# Patient Record
Sex: Female | Born: 1985 | Race: White | Hispanic: No | Marital: Married
Health system: Midwestern US, Community
[De-identification: ages and names within clinical notes are randomized; demographics above are authoritative.]

---

## 2020-11-26 NOTE — ED Notes (Signed)
 ED Patient Summary       ;          [facility]  [address], [place], [place] [address]  [phone]  Discharge Instructions (Patient)  Name:[name], [name]  DOB:  28-Dec-1986                   MRN: [id number]                   FIN: [id number]  Reason For Visit: Allergic reaction - minor; ALLERGIC REACTION  Final Diagnosis: Acute allergic reaction; Dental caries; Penicillin allergy; Urticaria     Visit Date: 11/26/2020 06:09:00  Address: [address] [name] [address]  Phone: [phone]     Emergency Department Providers:         Primary Physician:      EZZARD SIEVING [facility] would like to thank you for allowing us  to assist you with your healthcare needs. The following includes patient education materials and information regarding your injury/illness.     Follow-up Instructions:  You were seen today on an emergency basis. Please contact your primary care doctor for a follow up appointment. If you received a referral to a specialist doctor, it is important you follow-up as instructed.    It is important that you call your follow-up doctor to schedule and confirm the location of your next appointment. Your doctor may practice at multiple locations. The office location of your follow-up appointment may be different to the one written on your discharge instructions.    If you do not have a primary care doctor, please call [phone] for help in finding a [name] Provider. For help in finding a specialist doctor, please call [phone].    If your condition gets worse before your follow-up with your primary care doctor or specialist, please return to the Emergency Department.      Coronavirus 2019 (COVID-19) Reminders:     Patients age 101 - 49, with parental consent, and over age 36 can make an appointment for a COVID-19 vaccine. Patients can contact their [name] doctors' offices to schedule  an appointment to receive the COVID-19 vaccine. Patients who do not have a [name] physician can call [phone] to schedule vaccination appointments.      Follow Up Appointments:  Primary Care Provider:     Name: PCP,  NONE     Phone:                  With: Address: When:   Please follow-up with dentist as soon as possible of potential list of the dental clinics to be seen  Within 1 week       With: Address: When:   Follow up with primary care provider  Within 1 week                   New Medications  Printed Prescriptions  clindamycin (clindamycin 300 mg oral capsule) 1 Tabs Oral (given by mouth) 4 times a day for 7 Days. Refills: 0.  Last Dose:____________________  hydrOXYzine (hydrOXYzine hydrochloride 25 mg oral tablet) 1 Tabs Oral (given by mouth) 3 times a day for 7 Days. Refills: 0.  Last Dose:____________________  predniSONE  (predniSONE  20 mg oral tablet) 2 Tabs Oral (given by mouth) every day for 5 Days. Refills: 0.  Last Dose:____________________  traMADol (Ultram 50 mg  oral tablet) 1 Tabs Oral (given by mouth) every 6 hours as needed moderate pain (4-7) for 3 Days. Refills: 0.  Last Dose:____________________      Allergy Info: penicillin     Discharge Additional Information          Discharge Patient 11/26/20 6:51:00 EST      Patient Education Materials:                  Alcohol and Drug Clinic Referrals       [facility]   [phone]  [email or web address]-[email or web address]     Alcoholics Anonymous [place] ([employer] Hotline)  Ph- ([phone]     Co-dependents, and Adult Children of Alcoholics  Ph- [phone]     [facility]  [address], [place] [address]   [phone]     [facility]  [address] [place] [address]  [phone]  Inpatient and Outpatient Services for Drug and Alcohol Detox, Methadone Clinic and Detox (prescriptions for methadone are not given). Insured and uninsured.       [facility] Alcohol and Drug Abuse Hotline  Ph- [phone]     [facility] Commission on Alcohol and Drugs  [address], [place] [address]   ph- [phone] Narcotics Anonymous Hotline   [phone]     [facility] Alcohol [facility], [facility]   [address] [place] [address]   [phone], (referral coordinator) [phone]  Call for appointments.     [place]  Department of Health and Human Services: Substance Abuse and Mental Health Services Administration  SkateOasis.com.pt     National Suicide Prevention Lifeline  1-800-273-TALK 256-381-4019)  www.suicidepreventionlifeline.org     Drug Addiction Assistance Helpline  502 505 6018       Hives    Hives are itchy, red, puffy (swollen) areas of the skin. Hives can change in size and location on your body. Hives can come and go for hours, days, or weeks. Hives do not spread from person to person (noncontagious). Scratching, exercise, and stress can make your hives worse.      HOME CARE     Avoid things that cause your hives (triggers).     Take antihistamine medicines as told by your doctor. Do not drive while taking an antihistamine.     Take any other medicines for itching as told by your doctor.     Wear loose-fitting clothing.     Keep all doctor visits as told.    GET HELP RIGHT AWAY IF:     You have a fever.     Your tongue or lips are puffy.     You have trouble breathing or swallowing.     You feel tightness in the throat or chest.     You have belly (abdominal) pain.     You have lasting or severe itching that is not helped by medicine.     You have painful or puffy joints.    These problems may be the first sign of a life-threatening allergic reaction. Call your local emergency services (911 in [place]    MAKE SURE YOU:     Understand these instructions.     Will watch your condition.     Will get help right away if you are not doing well or get worse.  This information is  not intended to replace advice given to you by your health care provider. Make sure you discuss any questions you have with your health care provider.    Document Released: 09/23/2008 Document Revised: 06/15/2012 Document Reviewed: 10/03/2015  Elsevier Interactive Patient Education ?2016 Elsevier Inc.       Dental Caries    Dental caries is tooth decay. This decay can cause a hole in teeth (cavity) that can get bigger and deeper over time.      HOME CARE     Brush and floss your teeth. Do this at least two times a day.     Use a fluoride toothpaste.     Use a mouth rinse if told by your dentist or doctor.      Eat less sugary and starchy foods. Drink less sugary drinks.     Avoid snacking often on sugary and starchy foods. Avoid sipping often on sugary drinks.     Keep regular checkups and cleanings with your dentist.     Use fluoride supplements if told by your dentist or doctor.     Allow fluoride to be applied to teeth if told by your dentist or doctor.     This information is not intended to replace advice given to you by your health care provider. Make sure you discuss any questions you have with your health care provider.    Document Released: 09/23/2008 Document Revised: 01/05/2015 Document Reviewed: 12/17/2012  Elsevier Interactive Patient Education ?2016 Elsevier Inc.      ---------------------------------------------------------------------------------------------------------------------  [name]) encourages you to self-enroll in the [facility] Patient Portal.  [facility] Patient Portal will allow you to manage your personal health information securely from your own electronic device now and in the future.  To begin your Patient Portal enrollment process, please visit [email or web address] Click on "Sign up now" under [facility].  If you find that you need additional assistance on the [facility] Patient Portal or need a copy of your medical records,  please call the [facility] Medical Records Office at [phone].  Comment:

## 2020-11-26 NOTE — ED Notes (Signed)
ED Triage Note       ED Secondary Triage Entered On:  11/26/2020 6:23 EST    Performed On:  11/26/2020 6:21 EST by Fay Records               General Information   Barriers to Learning :   None evident   ED Home Meds Section :   Document assessment   [facility] ED Fall Risk Section :   Document assessment   ED Advance Directives Section :   Document assessment   ED Palliative Screen :   N/A (prefilled for <34yo)   Fay Records - 11/26/2020 6:21 EST   (As Of: 11/26/2020 06:23:01 EST)   Diagnoses(Active)    Acute allergic reaction  Date:   11/26/2020 ; Diagnosis Type:   Discharge ; Confirmation:   Confirmed ; Clinical Dx:   Acute allergic reaction ; Classification:   Medical ; Clinical Service:   Non-Specified ; Code:   ICD-10-CM ; Probability:   0 ; Diagnosis Code:   T78.40XA      Allergic reaction - minor  Date:   11/26/2020 ; Diagnosis Type:   Reason For Visit ; Confirmation:   Complaint of ; Clinical Dx:   Allergic reaction - minor ; Classification:   Medical ; Clinical Service:   Emergency medicine ; Code:   PNED ; Probability:   0 ; Diagnosis Code:   [id number]      Dental caries  Date:   11/26/2020 ; Diagnosis Type:   Discharge ; Confirmation:   Confirmed ; Clinical Dx:   Dental caries ; Classification:   Medical ; Clinical Service:   Non-Specified ; Code:   ICD-10-CM ; Probability:   0 ; Diagnosis Code:   K02.9      Penicillin allergy  Date:   11/26/2020 ; Diagnosis Type:   Discharge ; Confirmation:   Confirmed ; Clinical Dx:   Penicillin allergy ; Classification:   Medical ; Clinical Service:   Non-Specified ; Code:   ICD-10-CM ; Probability:   0 ; Diagnosis Code:   Z88.0      Urticaria  Date:   11/26/2020 ; Diagnosis Type:   Discharge ; Confirmation:   Confirmed ; Clinical Dx:   Urticaria ; Classification:   Medical ; Clinical Service:   Non-Specified ; Code:   ICD-10-CM ; Probability:   0 ; Diagnosis Code:   L50.9             -    Procedure History   (As Of: 11/26/2020 06:23:01  EST)     [facility] Fall Risk Assessment Tool   Hx of falling last 3 months ED Fall :   No   Patient confused or disoriented ED Fall :   No   Patient intoxicated or sedated ED Fall :   No   Patient impaired gait ED Fall :   No   Use a mobility assistance device ED Fall :   No   Patient altered elimination ED Fall :   No   [facility] ED Fall Score :   0    Fay Records - 11/26/2020 6:21 EST   ED Advance Directive   Advance Directive :   No   Fay Records - 11/26/2020 6:21 EST   Med Hx   Medication List   (As Of: 11/26/2020 06:23:01 EST)   No Known Home Medications     Fay Records - 11/26/2020 06:22:42      Normal  Order    diphenhydrAMINE 25 mg Cap  :   diphenhydrAMINE 25 mg Cap ; Status:   Completed ; Ordered As Mnemonic:   Benadryl ; Simple Display Line:   50 mg, 2 caps, Oral, Once ; Ordering Provider:   Merlinda Frederick; Catalog Code:   diphenhydrAMINE ; Order Dt/Tm:   11/26/2020 06:17:38 EST          famotidine 20 mg Tab  :   famotidine 20 mg Tab ; Status:   Completed ; Ordered As Mnemonic:   Pepcid ; Simple Display Line:   20 mg, 1 tabs, Oral, Once ; Ordering Provider:   Merlinda Frederick; Catalog Code:   famotidine ; Order Dt/Tm:   11/26/2020 06:17:38 EST          predniSONE 20 mg Tab  :   predniSONE 20 mg Tab ; Status:   Completed ; Ordered As Mnemonic:   predniSONE ; Simple Display Line:   60 mg, 3 tabs, Oral, Once ; Ordering Provider:   Merlinda Frederick; Catalog Code:   predniSONE ; Order Dt/Tm:   11/26/2020 06:17:38 EST

## 2020-11-26 NOTE — Discharge Summary (Signed)
ED Clinical Summary                         [facility]  [address]  [place], [place], [address]  [phone]           PERSON INFORMATION  Name: [name] [name], [name] [name] Age:  34 Years DOB: [birth year]   Sex: Female Language: English PCP: PCP,  NONE   Marital Status:  Married Phone: [phone] Med Service: MED-Medicine   MRN:  [id number] Acct# [id number] Arrival: 11/26/2020 06:09:00   Visit Reason: Allergic reaction - minor; ALLERGIC REACTION Acuity: 3 LOS: 000 00:49   Address:      [address] [place] [address]  Diagnosis:      Acute allergic reaction; Dental caries; Penicillin allergy; Urticaria  Printed Prescriptions:            Allergies      penicillin (Rash)      Medications Administered During Visit:                  Medication Dose Route   diphenhydrAMINE 50 mg Oral   famotidine 20 mg Oral   predniSONE 60 mg Oral       Patient Medication List:              clindamycin (clindamycin 300 mg oral capsule) 1 Tabs Oral (given by mouth) 4 times a day for 7 Days. Refills: 0.  hydrOXYzine (hydrOXYzine hydrochloride 25 mg oral tablet) 1 Tabs Oral (given by mouth) 3 times a day for 7 Days. Refills: 0.  predniSONE (predniSONE 20 mg oral tablet) 2 Tabs Oral (given by mouth) every day for 5 Days. Refills: 0.  traMADol (Ultram 50 mg oral tablet) 1 Tabs Oral (given by mouth) every 6 hours as needed moderate pain (4-7) for 3 Days. Refills: 0.         Major Tests and Procedures:  The following procedures and tests were performed during your ED visit.  COMMONPROCEDURES%>  COMMON PROCEDURESCOMMENTS%>          Laboratory Orders  No laboratory orders were placed.              Radiology Orders  No radiology orders were placed.              Patient Care Orders  Name Status Details   Discharge Patient Ordered 11/26/20 6:51:00 EST   ED Assessment Adult Completed 11/26/20 6:17:18 EST, 11/26/20 6:17:18 EST   ED Secondary Triage Completed 11/26/20 6:17:18 EST, 11/26/20 6:17:18 EST   ED  Triage Adult Completed 11/26/20 6:09:59 EST, 11/26/20 6:09:59 EST             PROVIDER INFORMATION               Provider Role Assigned Johnette Abraham [facility] ED Provider 11/26/2020 06:12:25    Fay Records ED Nurse 11/26/2020 06:16:25        Attending Physician:  Novella Olive ETHAN-MD     Admit Doc  LEWIS,  DANIEL ETHAN-MD     Consulting Doc       VITALS INFORMATION  Vital Sign Triage Latest   Temp Oral ORAL_1%>36.6 degC ORAL%>36.6 degC   Temp Temporal TEMPORAL_1%> TEMPORAL%>   Temp Intravascular INTRAVASCULAR_1%> INTRAVASCULAR%>   Temp Axillary AXILLARY_1%> AXILLARY%>   Temp Rectal RECTAL_1%> RECTAL%>   02 Sat 95 % 100 %   Respiratory Rate RATE_1%>16 br/min RATE%>17 br/min  Peripheral Pulse Rate PULSE RATE_1%> PULSE RATE%>   Apical Heart Rate HEART RATE_1%> HEART RATE%>   Blood Pressure BLOOD PRESSURE_1%>/ BLOOD PRESSURE_1%>92 mmHg BLOOD PRESSURE%>117 mmHg / BLOOD PRESSURE%>76 mmHg                 Immunizations      No Immunizations Documented This Visit          DISCHARGE INFORMATION   Discharge Disposition: H Outpt-Sent Home   Discharge Location:    Home   Discharge Date and Time:    11/26/2020 06:58:54   ED Checkout Date and Time:    11/26/2020 06:58:54     DEPART REASON INCOMPLETE INFORMATION               Depart Action Incomplete Reason   Interactive View/I&O Recently assessed               Problems      No Problems Documented              Smoking Status      No Smoking Status Documented         PATIENT EDUCATION INFORMATION  Instructions:       Alcohol & Drug Clinic Referrals (CUSTOM); Hives, Easy-to-Read; Dental Caries, Easy-to-Read     Follow up:                    With: Address: When:   Please follow-up with dentist as soon as possible of potential list of the dental clinics to be seen  Within 1 week       With: Address: When:   Follow up with primary care provider  Within 1 week           ED PROVIDER DOCUMENTATION     Patient:   [name] [name], [name] [name]             MRN: [id number]            FIN:  [phone]               Age:   34 years     Sex:  Female     DOB:  [birth year]   Associated Diagnoses:   Dental caries; Acute allergic reaction; Urticaria; Penicillin allergy   Author:   Merlinda Frederick      Basic Information   Time seen: Immediately upon arrival.   History source: Patient.   Arrival mode: Private vehicle.   Additional information: Chief Complaint from Nursing Triage Note   Chief Complaint  Chief Complaint: pt reports she thinks she is having an allergic reaction to penicillin that she took for a tooth infection.  pt reports itching all over (11/26/20 06:14:00).      History of Present Illness   The patient presents with dental pain and mouth pain.  The onset was 3  days ago.  The course/duration of symptoms is fluctuating in intensity.  Type of injury: none.  Location: Left lower. The character of symptoms is pain.  The degree of pain is moderate.  The exacerbating factor is drinking.  The relieving factor is rest.  Risk factors consist of none.  Prior episodes: occasional.  Therapy today: see nurses notes.  Associated symptoms: denies fever, denies chills and denies sore throat.  Additional history: Patient has penicillin allergy, took a dose of penicillin and then now also has a secondary complaint has an acute allergic reaction with itching diffusely.  She has a mild urticarial rash noted over  the face and upper extremities denies any shortness of breath denies any fevers or chills denies any radiation of symptoms.        Review of Systems   Constitutional symptoms:  No fever, no chills.    ENMT symptoms:  +dental caries   .             Additional review of systems information: All other systems reviewed and otherwise negative.      Health Status   Allergies:    Allergic Reactions (All)  Mild  Penicillin- Rash..   Medications:  (Selected)   Inpatient Medications  Ordered  Benadryl: 50 mg, Oral, Once  Pepcid: 20 mg, Oral, Once  predniSONE: 60 mg, 3 tabs, Oral, Once.      Past Medical/  Family/ Social History   Medical history:    No active or resolved past medical history items have been selected or recorded., Reviewed as documented in chart.   Surgical history:    No active procedure history items have been selected or recorded., Reviewed as documented in chart.   Family history: Not significant.   Social history:    Social & Psychosocial Habits    No Data Available  , Reviewed as documented in chart.   Problem list: Per nurse's notes.      Physical Examination               Vital Signs   Vital Signs   11/26/2020 6:14 EST Systolic Blood Pressure 142 mmHg  HI    Diastolic Blood Pressure 92 mmHg  HI    Temperature Oral 36.6 degC    Heart Rate Monitored 89 bpm    Respiratory Rate 16 br/min    SpO2 95 %   .   Measurements   11/26/2020 6:17 EST Body Mass Index est meas 43.28 kg/m2    Body Mass Index Measured 43.28 kg/m2   11/26/2020 6:14 EST Height/Length Measured 163 cm    Weight Dosing 115 kg   .   Basic Oxygen Information   11/26/2020 6:17 EST Oxygen Therapy Room air   11/26/2020 6:14 EST Oxygen Therapy Room air    SpO2 95 %   .   General:  Alert, no acute distress.    Skin:  Warm, dry, Mild urticarial rash noted over face and upper extremities.    Head:  Normocephalic, atraumatic.    Neck:  Supple.   Eye:  Pupils are equal, round and reactive to light, extraocular movements are intact.    Ears, nose, mouth and throat:  Tympanic membranes clear, oral mucosa moist, no pharyngeal erythema or exudate, Tooth: Left, lower, avulsion, dental caries.    Cardiovascular:  Regular rate and rhythm, No murmur.    Respiratory:  Lungs are clear to auscultation, respirations are non-labored.    Neurological:  Alert and oriented to person, place, time, and situation, No focal neurological deficit observed, CN II-XII intact, normal sensory observed, normal motor observed.    Lymphatics:  No lymphadenopathy.   Psychiatric:  Cooperative, appropriate mood & affect.       Medical Decision Making   Differential  Diagnosis:  Dental pain, dental carries.    Documents reviewed:  Emergency department nurses' notes, flowsheet, vital signs no emergency department records,  no prior records.       Reexamination/ Reevaluation   Notes: ultram clindamycin, dental clinic follow-up for the acute reaction Benadryl prednisone and Pepcid.  Follow-up PMD/dentist.      Impression and Plan  Diagnosis   Dental caries (ICD10-CM K02.9, Discharge, Medical)   Dental caries (ICD10-CM K02.9, Discharge, Medical)   Acute allergic reaction (ICD10-CM T78.40XA, Discharge, Medical)   Urticaria (ICD10-CM L50.9, Discharge, Medical)   Penicillin allergy (ICD10-CM Z88.0, Discharge, Medical)   Plan   Condition: Stable.    Disposition: Discharged: Time  11/26/2020 07:00:00, to home.    Prescriptions: Launch prescriptions   Pharmacy:  Ultram 50 mg oral tablet (Prescribe): 50 mg, 1 tabs, Oral, q6hr, for 3 days, PRN: moderate pain (4-7), 12 tabs, 0 Refill(s)  predniSONE 20 mg oral tablet (Prescribe): 40 mg, 2 tabs, Oral, Daily, for 5 days, 10 tabs, 0 Refill(s)  hydrOXYzine hydrochloride 25 mg oral tablet (Prescribe): 25 mg, 1 tabs, Oral, TID, for 7 days, 21 tabs, 0 Refill(s)  clindamycin 300 mg oral capsule (Prescribe): 1 tabs, Oral, QID, for 7 days, 28 tabs, 0 Refill(s).    Patient was given the following educational materials: Pt. education, Dental Caries, Easy-to-Read, Hives, Easy-to-Read, Alcohol & Drug Clinic Referrals (CUSTOM).    Follow up with: Follow up with primary care provider Within 1 week; Please follow-up with dentist as soon as possible of potential list of the dental clinics to be seen Within 1 week.    Counseled: I had a detailed discussion with the patient and/or guardian regarding the historical points/exam findings supporting the discharge diagnosis and need for outpatient followup. Discussed the need to return to the ER if symptoms persist/worsen, or for any questions/concerns that arise at home.

## 2020-11-26 NOTE — ED Notes (Signed)
ED Triage Note       ED Triage Adult Entered On:  11/26/2020 6:17 EST    Performed On:  11/26/2020 6:14 EST by Thane Edu C-RN               Triage   Numeric Rating Pain Scale :   8   Chief Complaint :   pt reports she thinks she is having an allergic reaction to penicillin that she took for a tooth infection.  pt reports itching all over   [place] Mode of Arrival :   Private vehicle   Infectious Disease Documentation :   Document assessment   Patient received chemo or biotherapy last 48 hrs? :   No   Temperature Oral :   36.6 degC(Converted to: 97.9 degF)    Heart Rate Monitored :   89 bpm   Respiratory Rate :   16 br/min   Systolic Blood Pressure :   142 mmHg (HI)    Diastolic Blood Pressure :   92 mmHg (HI)    SpO2 :   95 %   Oxygen Therapy :   Room air   Patient presentation :   None of the above   Chief Complaint or Presentation suggest infection :   No   Dosing Weight Obtained By :   Patient stated   Weight Dosing :   115 kg(Converted to: 253 lb 9 oz)    Height :   163 cm(Converted to: 5 ft 4 in)    Body Mass Index Dosing :   43 kg/m2   Thane Edu C-RN - 11/26/2020 6:14 EST   DCP GENERIC CODE   Tracking Acuity :   3   Tracking Group :   ED [facility]   Thane Edu C-RN - 11/26/2020 6:14 EST   ED General Section :   Document assessment   Pregnancy Status :   Patient denies   Last Menstrual Period :   10/12/2020 EDT   ED Allergies Section :   Document assessment   ED Reason for Visit Section :   Document assessment   Thane Edu C-RN - 11/26/2020 6:14 EST   ID Risk Screen Symptoms   Recent Travel History :   No recent travel   Close Contact with COVID-19 ID :   No   Last 14 days COVID-19 ID :   No   TB Symptom Screen :   No symptoms   C. diff Symptom/History ID :   Neither of the above   Thane Edu C-RN - 11/26/2020 6:14 EST   Allergies   (As Of: 11/26/2020 06:17:17 EST)   Allergies (Active)   penicillin  Estimated Onset Date:   Unspecified ; Reactions:   Rash ;  Created By:   Thane Edu C-RN; Reaction Status:   Active ; Category:   Drug ; Substance:   penicillin ; Type:   Allergy ; Severity:   Mild ; Updated By:   Thane Edu C-RN; Reviewed Date:   11/26/2020 6:16 EST        Psycho-Social   Last 3 mo, thoughts killing self/others :   Patient denies   Right click within box for Suspected Abuse policy link. :   None   Feels Safe Where Live :   Yes   Thane Edu C-RN - 11/26/2020 6:14 EST   ED Reason for Visit   (As Of: 11/26/2020 06:17:17 EST)  Diagnoses(Active)    Allergic reaction - minor  Date:   11/26/2020 ; Diagnosis Type:   Reason For Visit ; Confirmation:   Complaint of ; Clinical Dx:   Allergic reaction - minor ; Classification:   Medical ; Clinical Service:   Emergency medicine ; Code:   PNED ; Probability:   0 ; Diagnosis Code:   [id number]

## 2020-11-26 NOTE — ED Provider Notes (Signed)
Dental Caries        Patient:   [name] [name], [name] [name]             MRN: [id number]            FIN: [id number]               Age:   34 years     Sex:  Female     DOB:  24-May-1986   Associated Diagnoses:   Dental caries; Acute allergic reaction; Urticaria; Penicillin allergy   Author:   Merlinda Frederick      Basic Information   Time seen: Immediately upon arrival.   History source: Patient.   Arrival mode: Private vehicle.   Additional information: Chief Complaint from Nursing Triage Note   Chief Complaint  Chief Complaint: pt reports she thinks she is having an allergic reaction to penicillin that she took for a tooth infection.  pt reports itching all over (11/26/20 06:14:00).      History of Present Illness   The patient presents with dental pain and mouth pain.  The onset was 3  days ago.  The course/duration of symptoms is fluctuating in intensity.  Type of injury: none.  Location: Left lower. The character of symptoms is pain.  The degree of pain is moderate.  The exacerbating factor is drinking.  The relieving factor is rest.  Risk factors consist of none.  Prior episodes: occasional.  Therapy today: see nurses notes.  Associated symptoms: denies fever, denies chills and denies sore throat.  Additional history: Patient has penicillin allergy, took a dose of penicillin and then now also has a secondary complaint has an acute allergic reaction with itching diffusely.  She has a mild urticarial rash noted over the face and upper extremities denies any shortness of breath denies any fevers or chills denies any radiation of symptoms.        Review of Systems   Constitutional symptoms:  No fever, no chills.    ENMT symptoms:  +dental caries   .             Additional review of systems information: All other systems reviewed and otherwise negative.      Health Status   Allergies:    Allergic Reactions (All)  Mild  Penicillin- Rash..   Medications:  (Selected)   Inpatient Medications  Ordered  Benadryl: 50 mg, Oral,  Once  Pepcid: 20 mg, Oral, Once  predniSONE: 60 mg, 3 tabs, Oral, Once.      Past Medical/ Family/ Social History   Medical history:    No active or resolved past medical history items have been selected or recorded., Reviewed as documented in chart.   Surgical history:    No active procedure history items have been selected or recorded., Reviewed as documented in chart.   Family history: Not significant.   Social history:    Social & Psychosocial Habits    No Data Available  , Reviewed as documented in chart.   Problem list: Per nurse's notes.      Physical Examination               Vital Signs   Vital Signs   11/26/2020 6:14 EST Systolic Blood Pressure 142 mmHg  HI    Diastolic Blood Pressure 92 mmHg  HI    Temperature Oral 36.6 degC    Heart Rate Monitored 89 bpm    Respiratory Rate 16 br/min    SpO2 95 %   .  Measurements   11/26/2020 6:17 EST Body Mass Index est meas 43.28 kg/m2    Body Mass Index Measured 43.28 kg/m2   11/26/2020 6:14 EST Height/Length Measured 163 cm    Weight Dosing 115 kg   .   Basic Oxygen Information   11/26/2020 6:17 EST Oxygen Therapy Room air   11/26/2020 6:14 EST Oxygen Therapy Room air    SpO2 95 %   .   General:  Alert, no acute distress.    Skin:  Warm, dry, Mild urticarial rash noted over face and upper extremities.    Head:  Normocephalic, atraumatic.    Neck:  Supple.   Eye:  Pupils are equal, round and reactive to light, extraocular movements are intact.    Ears, nose, mouth and throat:  Tympanic membranes clear, oral mucosa moist, no pharyngeal erythema or exudate, Tooth: Left, lower, avulsion, dental caries.    Cardiovascular:  Regular rate and rhythm, No murmur.    Respiratory:  Lungs are clear to auscultation, respirations are non-labored.    Neurological:  Alert and oriented to person, place, time, and situation, No focal neurological deficit observed, CN II-XII intact, normal sensory observed, normal motor observed.    Lymphatics:  No lymphadenopathy.   Psychiatric:   Cooperative, appropriate mood & affect.       Medical Decision Making   Differential Diagnosis:  Dental pain, dental carries.    Documents reviewed:  Emergency department nurses' notes, flowsheet, vital signs no emergency department records,  no prior records.       Reexamination/ Reevaluation   Notes: ultram clindamycin, dental clinic follow-up for the acute reaction Benadryl prednisone and Pepcid.  Follow-up PMD/dentist.      Impression and Plan   Diagnosis   Dental caries (ICD10-CM K02.9, Discharge, Medical)   Dental caries (ICD10-CM K02.9, Discharge, Medical)   Acute allergic reaction (ICD10-CM T78.40XA, Discharge, Medical)   Urticaria (ICD10-CM L50.9, Discharge, Medical)   Penicillin allergy (ICD10-CM Z88.0, Discharge, Medical)   Plan   Condition: Stable.    Disposition: Discharged: Time  11/26/2020 07:00:00, to home.    Prescriptions: [facility] prescriptions   Pharmacy:  Ultram 50 mg oral tablet (Prescribe): 50 mg, 1 tabs, Oral, q6hr, for 3 days, PRN: moderate pain (4-7), 12 tabs, 0 Refill(s)  predniSONE 20 mg oral tablet (Prescribe): 40 mg, 2 tabs, Oral, Daily, for 5 days, 10 tabs, 0 Refill(s)  hydrOXYzine hydrochloride 25 mg oral tablet (Prescribe): 25 mg, 1 tabs, Oral, TID, for 7 days, 21 tabs, 0 Refill(s)  clindamycin 300 mg oral capsule (Prescribe): 1 tabs, Oral, QID, for 7 days, 28 tabs, 0 Refill(s).    Patient was given the following educational materials: Pt. education, Dental Caries, Easy-to-Read, Hives, Easy-to-Read, Alcohol & Drug Clinic Referrals (CUSTOM).    Follow up with: Follow up with primary care provider Within 1 week; Please follow-up with dentist as soon as possible of potential list of the dental clinics to be seen Within 1 week.    Counseled: I had a detailed discussion with the patient and/or guardian regarding the historical points/exam findings supporting the discharge diagnosis and need for outpatient followup. Discussed the need to return to the ER if symptoms persist/worsen, or for  any questions/concerns that arise at home.    Signature Line     Electronically Signed on 11/26/2020 06:22 AM EST   ________________________________________________   Novella Olive ETHAN-MD

## 2020-11-26 NOTE — ED Notes (Signed)
 ED Patient Education Note     ;Patient Education Materials Follows:               Alcohol and Drug Clinic Referrals       [facility]   [phone]  [email or web address]     Alcoholics Anonymous [place] ([employer] Hotline)  Ph- [phone]     Co-dependents, and Adult Children of Alcoholics  Ph- [phone]     [facility]  [address] [place] [address]   [phone]     [facility]  [address], [place] [address]  [phone]  Inpatient and Outpatient Services for Drug and Alcohol Detox, Methadone Clinic and Detox (prescriptions for methadone are not given). Insured and uninsured.      [facility] Alcohol and Drug Abuse Hotline  Ph- [phone]     [facility] Commission on Alcohol and Drugs  [address], [place] [address]   ph- [phone] Narcotics Anonymous Hotline   9127349127     [facility]   [address], [place] [address]   [phone], (referral coordinator) [phone]  Call for appointments.     [place]  Department of Health and Human Services: Substance Abuse and Mental Health Services Administration  [employer].pt     National Suicide Prevention Lifeline  1-800-273-TALK 413-228-7804)  www.suicidepreventionlifeline.org     Drug Addiction Assistance Helpline  940-034-6519      Allergy     Hives    Hives are itchy, red, puffy (swollen) areas of the skin. Hives can change in size and location on your body. Hives can come and go for hours, days, or weeks. Hives do not spread from person to person (noncontagious). Scratching, exercise, and stress can make your hives worse.      HOME CARE     Avoid things that cause your hives (triggers).     Take antihistamine medicines as told by your doctor. Do not drive while taking an antihistamine.     Take any other medicines for itching as told  by your doctor.     Wear loose-fitting clothing.     Keep all doctor visits as told.    GET HELP RIGHT AWAY IF:     You have a fever.     Your tongue or lips are puffy.     You have trouble breathing or swallowing.     You feel tightness in the throat or chest.     You have belly (abdominal) pain.     You have lasting or severe itching that is not helped by medicine.     You have painful or puffy joints.    These problems may be the first sign of a life-threatening allergic reaction. Call your local emergency services (911 in [place]    MAKE SURE YOU:     Understand these instructions.     Will watch your condition.     Will get help right away if you are not doing well or get worse.    This information is not intended to replace advice given to you by your health care provider. Make sure you discuss any questions you have with your health care provider.    Document Released: 09/23/2008 Document Revised: 06/15/2012 Document Reviewed: 10/03/2015  Elsevier Interactive Patient Education ?2016 [employer]      Dentistry     Dental Caries    Dental caries is tooth decay. This decay can cause a hole in teeth (cavity) that can get bigger and deeper over time.      HOME CARE     Brush and floss your teeth. Do this at least two times a day.     Use a fluoride toothpaste.     Use a mouth rinse if told by your dentist or doctor.      Eat less sugary and starchy foods. Drink less sugary drinks.     Avoid snacking often on sugary and starchy foods. Avoid sipping often on sugary drinks.     Keep regular checkups and cleanings with your dentist.     Use fluoride supplements if told by your dentist or doctor.     Allow fluoride to be applied to teeth if told by your dentist or doctor.     This information is not intended to replace advice given to you by your health care provider. Make sure you discuss any questions you have with your health care provider.    Document Released: 09/23/2008 Document Revised: 01/05/2015 Document  Reviewed: 12/17/2012  Elsevier Interactive Patient Education ?2016 [employer] Inc.

## 2021-04-08 LAB — CBC WITH AUTO DIFFERENTIAL
Absolute Baso #: 0.1 10*3/uL (ref 0.0–0.2)
Absolute Eos #: 0.3 10*3/uL (ref 0.0–0.5)
Absolute Lymph #: 1.6 10*3/uL (ref 1.0–3.2)
Absolute Mono #: 0.5 10*3/uL (ref 0.3–1.0)
Basophils %: 0.7 % (ref 0.0–2.0)
Eosinophils %: 4.4 % (ref 0.0–7.0)
Hematocrit: 35.5 % (ref 34.0–47.0)
Hemoglobin: 11.4 g/dL — ABNORMAL LOW (ref 11.5–15.7)
Immature Grans (Abs): 0.01 10*3/uL (ref 0.00–0.06)
Immature Granulocytes: 0.1 % (ref 0.0–0.6)
Lymphocytes: 23.9 % (ref 15.0–45.0)
MCH: 28.1 pg (ref 27.0–34.5)
MCHC: 32.1 g/dL (ref 32.0–36.0)
MCV: 87.7 fL (ref 81.0–99.0)
MPV: 10.7 fL (ref 7.2–13.2)
Monocytes: 7.7 % (ref 4.0–12.0)
Neutrophils %: 63.2 % (ref 42.0–74.0)
Neutrophils Absolute: 4.3 10*3/uL (ref 1.6–7.3)
Platelets: 294 10*3/uL (ref 140–440)
RBC: 4.05 x10e6/mcL (ref 3.60–5.20)
RDW: 13.3 % (ref 11.0–16.0)
WBC: 6.9 10*3/uL (ref 3.8–10.6)

## 2021-04-08 LAB — BASIC METABOLIC PANEL
Anion Gap: 12 mmol/L (ref 2–17)
BUN: 14 mg/dL (ref 6–20)
CO2: 23 mmol/L (ref 22–29)
Calcium: 9.1 mg/dL (ref 8.6–10.0)
Chloride: 106 mmol/L (ref 98–107)
Creatinine: 0.6 mg/dL (ref 0.5–1.0)
GFR African American: 138 mL/min/{1.73_m2} (ref >=90)
GFR Non-African American: 119 mL/min/{1.73_m2} (ref >=90)
Glucose: 97 mg/dL (ref 70–99)
OSMOLALITY CALCULATED: 280 mOsm/kg (ref 270–287)
Potassium: 3.5 mmol/L (ref 3.5–5.3)
Sodium: 140 mmol/L (ref 135–145)

## 2021-04-08 LAB — LACTIC ACID: Lactic Acid: 1.2 mmol/L (ref 0.5–2.0)

## 2021-04-08 NOTE — Discharge Summary (Signed)
 ED Clinical Summary                         [facility]  [address]  [place], [place], [address]  [phone]           PERSON INFORMATION  Name: [name] [name], [name] [name] Age:  35 Years DOB: April 21, 1986   Sex: Female Language: English PCP: PCP,  NONE   Marital Status:  Married Phone: [phone] Med Service: MED-Medicine   MRN:  [id number] Acct# [id number] Arrival: 04/08/2021 17:30:00   Visit Reason: Facial swelling; INFECTED TOOTH Acuity: 3 LOS: 000 03:56   Address:      [address] [place] [address]  Diagnosis:      Dental abscess; Facial cellulitis; Thyroid nodule  Printed Prescriptions:            Allergies      penicillin (Rash)      Medications Administered During Visit:                  Medication Dose Route   ketorolac 15 mg IV Push   clindamycin 900 mg IV Piggyback   Sodium Chloride  0.9% 1000 mL IV Piggyback       Patient Medication List:              clindamycin  naproxen (Anaprox-DS 550 mg oral tablet) 1 Tabs Oral (given by mouth) 2 times a day as needed moderate pain (4-7) for 7 Days. Refills: 0.         Major Tests and Procedures:  The following procedures and tests were performed during your ED visit.  COMMONPROCEDURES%>  COMMON PROCEDURESCOMMENTS%>          Laboratory Orders  Name Status Details   BMP Completed Blood, Stat, ST - Stat, 04/08/21 19:03:00 EDT, 04/08/21 19:04:00 EDT, Nurse collect, [name], Print label Y/N   C Blood Ordered Blood, Stat, ST - Stat, 04/08/21 19:03:00 EDT, 04/08/21 19:04:00 EDT, Nurse collect, 1 of 2   CBCDIFF Completed Blood, Stat, ST - Stat, 04/08/21 19:03:00 EDT, 04/08/21 19:04:00 EDT, Nurse collect, [name], Print label Y/N   Lactic Completed Blood, Stat, ST - Stat, 04/08/21 19:05:00 EDT, 04/08/21 19:05:00 EDT, Nurse collect, [name], Print label Y/N               Radiology Orders  Name Status Details   CT Soft Tissue Neck w/ Contrast Completed 04/08/21 19:03:00 EDT, STAT  1 hour or less, Reason: Other (please specify), Reason: soft tissue neck swelling, abscess, Transport Mode: STRETCHER, Rad Type, pp_set_radiology_subspecialty               Patient Care Orders  Name Status Details   Discharge Patient Ordered 04/08/21 21:06:00 EDT   Discontinue IV Completed 04/08/21 21:06:07 EDT, 04/08/21 21:06:07 EDT   ED Assessment Adult Completed 04/08/21 17:36:31 EDT, 04/08/21 17:36:31 EDT   ED Secondary Triage Completed 04/08/21 17:36:31 EDT, 04/08/21 17:36:31 EDT   ED Triage Adult Completed 04/08/21 17:30:27 EDT, 04/08/21 17:30:27 EDT             PROVIDER INFORMATION               Provider Role Assigned Sampson Jon Mems [facility] ED Nurse 04/08/2021 18:35:53 04/08/2021 21:02:13   [name] ED MidLevel 04/08/2021 18:36:19    Squibb, Margeaux-RN ED Nurse 04/08/2021 21:01:21  Attending Physician:  [name]     Admit Doc  [name]     Consulting Doc       VITALS INFORMATION  Vital Sign Triage Latest   Temp Oral ORAL_1%>36.3 degC ORAL%>36.3 degC   Temp Temporal TEMPORAL_1%> TEMPORAL%>   Temp Intravascular INTRAVASCULAR_1%> INTRAVASCULAR%>   Temp Axillary AXILLARY_1%> AXILLARY%>   Temp Rectal RECTAL_1%> RECTAL%>   02 Sat 97 % 100 %   Respiratory Rate RATE_1%>18 br/min RATE%>18 br/min   Peripheral Pulse Rate PULSE RATE_1%> PULSE RATE%>   Apical Heart Rate HEART RATE_1%> HEART RATE%>   Blood Pressure BLOOD PRESSURE_1%>/ BLOOD PRESSURE_1%>93 mmHg BLOOD PRESSURE%>140 mmHg / BLOOD PRESSURE%>77 mmHg                 Immunizations      No Immunizations Documented This Visit          DISCHARGE INFORMATION   Discharge Disposition: H Outpt-Sent Home   Discharge Location:    Home   Discharge Date and Time:    04/08/2021 21:26:33   ED Checkout Date and Time:    04/08/2021 21:26:33     DEPART REASON INCOMPLETE INFORMATION               Depart Action Incomplete Reason   Interactive View/I&O Recently assessed               Problems      No Problems Documented               Smoking Status      No Smoking Status Documented         PATIENT EDUCATION INFORMATION  Instructions:       Cellulitis, Adult, Easy-to-Read; Dental Abscess, Easy-to-Read; Dental Pain, Easy-to-Read     Follow up:                    With: Address: When:   Please call [phone] to find a primary care doctor.  Within 1 week       With: Address: When:   [name] [address] [place], [place] [address]  [phone] Business (1) Within 1 week       With: Address: When:   [name] [address] [place] [address]  [phone] Business (1) Within 1 week       With: Address: When:   Return to Emergency Department  Within 1 to 2 days   Comments:   Return to ED if symptoms worsen       With: Address: When:   Follow-up with your dentist at [facility] dental tomorrow for reevaluation continue with her current symptoms.  In 1 day           ED PROVIDER DOCUMENTATION     Patient:   [name] [name], [name] [name]            MRN: [id number]            FIN: [id number]               Age:   35 years     Sex:  Female     DOB:  Dec 18, 1986   Associated Diagnoses:   Facial cellulitis; Dental abscess; Thyroid nodule   Author:   [name]      Basic Information   Time seen: Provider Seen (ST)   ED Provider/Time:    [name] / 04/08/2021 18:36  ,  Date & time 04/08/2021 18:40:00.   History source: Patient.   Arrival mode: Private vehicle.   History limitation: None.   Additional information: Chief Complaint from Nursing Triage Note   Chief Complaint  Chief Complaint: tooth infection to L side, increased swelling to area that began this afternoon, started on clindamycin this morning (04/08/21 17:34:00).      History of Present Illness   The patient presents with dental pain and mouth pain.  The onset was today.  The course/duration of symptoms is worsening.  Type of injury: none.  The location where the incident occurred was at home.  Location: Left lower gums. The character of symptoms is pain,  swelling and redness.  The degree of pain is minimal.  The exacerbating factor is none.  The relieving factor is none.  Risk factors consist of none.  Prior episodes: none.  Therapy today: prescription medications including oral clindamycin.  Associated symptoms: facial Swelling that extends into the neck..  Additional history: Patient presents for evaluation for left lower dental pain and abscess formation.  Patient that she has been having dental work over the past month.  Patient states she saw her dentist this morning who placed her on oral clindamycin due to a small area of swelling along the left lower gumline area.  Patient states she was advised to ER symptoms worsen today.  Patient states that her swelling has increased in severity and now is tracking towards her neck.  On exam there is mild to moderate swelling noted along the left lower gumline that extends into the anterior neck.  It is mildly tender.  With minimal induration.  No obvious drainage noted.  Patient denies any fever.  Patient's oral airway is patent and is able to tolerate her own oral secretions.  Patient speaks in full sentences.  Patient is not having any difficulty breathing or swallowing.  Patient otherwise is resting comfortably is in no acute distress patient states she did take 2 doses of clindamycin today prior to arrival to the ER.        Review of Systems   Constitutional symptoms:  Negative except as documented in HPI.   Skin symptoms:  Facial swelling along the left lower gumline and anterior neck.   ENMT symptoms:  Mouth: Pain, swelling, dental pain.   Respiratory symptoms:  Negative except as documented in HPI.             Additional review of systems information: All other systems reviewed and otherwise negative, All systems reviewed as documented in chart.      Health Status   Allergies:    Allergic Reactions (Selected)  Mild  Penicillin- Rash..   Medications:  (Selected)   Inpatient Medications  Ordered  Isovue-300: 100  mL, IV Contrast, Once  Sodium Chloride  0.9% bolus: 1,000 mL, 2000 mL/hr, IV Piggyback, Once  Toradol: 15 mg, 1 mL, IV Push, Once  clindamycin IVPB: 900 mg, IV Piggyback, Once  Documented Medications  Documented  clindamycin: 0 Refill(s).      Past Medical/ Family/ Social History   Surgical history: Reviewed as documented in chart.   Family history: Reviewed as documented in chart.   Social history: Reviewed as documented in chart.   Problem list:    No qualifying data available  , per nurse's notes.      Physical Examination               Vital Signs   Vital Signs   04/08/2021 17:34 EDT  Systolic Blood Pressure 154 mmHg  HI    Diastolic Blood Pressure 93 mmHg  HI    Temperature Oral 36.3 degC    Heart Rate Monitored 106 bpm  HI    Respiratory Rate 18 br/min    SpO2 97 %   .   Measurements   04/08/2021 17:36 EDT Body Mass Index est meas 53.07 kg/m2    Body Mass Index Measured 53.07 kg/m2   04/08/2021 17:34 EDT Height/Length Measured 163 cm    Weight Dosing 141 kg   .   Basic Oxygen Information   04/08/2021 18:41 EDT Oxygen Therapy Room air   04/08/2021 17:34 EDT Oxygen Therapy Room air    SpO2 97 %   .   General:  Alert, no acute distress.    Skin:  Warm, dry, Swelling noted to the left lower jaw and facial area.  This extends into the left anterior neck.  No obvious drainage.  This is concerning for cellulitis with underlying abscess..    Head:  Normocephalic.   Neck:  Supple.   Eye:  Pupils are equal, round and reactive to light, extraocular movements are intact.    Ears, nose, mouth and throat:  Oral mucosa moist, no pharyngeal erythema or exudate.    Cardiovascular:  Regular rate and rhythm.   Respiratory:  Lungs are clear to auscultation, respirations are non-labored.    Chest wall:  No tenderness.   Back:  Nontender, Normal range of motion.    Musculoskeletal:  Normal ROM, normal strength.    Gastrointestinal:  Soft, Nontender.    Neurological:  Alert and oriented to person, place, time, and situation, No focal  neurological deficit observed, normal sensory observed, normal motor observed, normal speech observed, normal coordination observed.    Lymphatics:  No lymphadenopathy.   Psychiatric:  Cooperative, appropriate mood & affect.       Medical Decision Making   Differential Diagnosis:  Dental pain, dental abscess, Facial abscess.    Rationale:  PA/NP reviewed with co-signing physician: any ECG, lab results, radiology studies, medication, diagnosis, and plan of care.   Documents reviewed:  Emergency department nurses' notes.   Results review:  Lab results : Lab View   04/08/2021 19:34 EDT Estimated Creatinine Clearance 186.58 mL/min   04/08/2021 19:14 EDT Lactic Acid Lvl 1.2 mmol/L   04/08/2021 19:11 EDT WBC 6.9 x10e3/mcL    RBC 4.05 x10e6/mcL    Hgb 11.4 g/dL  LOW    HCT 64.4 %    MCV 87.7 fL    MCH 28.1 pg    MCHC 32.1 g/dL    RDW 86.6 %    Platelet 294 x10e3/mcL    MPV 10.7 fL    Neutro Auto 63.2 %    Neutro Absolute 4.3 x10e3/mcL    Immature Grans Percent 0.1 %    Immature Grans Absolute 0.01 x10e3/mcL    Lymph Auto 23.9 %    Lymph Absolute 1.6 x10e3/mcL    Mono Auto 7.7 %    Mono Absolute 0.5 x10e3/mcL    Eosinophil Percent 4.4 %    Eos Absolute 0.3 x10e3/mcL    Basophil Auto 0.7 %    Baso Absolute 0.1 x10e3/mcL    Sodium Lvl 140 mmol/L    Potassium Lvl 3.5 mmol/L    Chloride 106 mmol/L    CO2 23 mmol/L    Glucose Random 97 mg/dL    BUN 14 mg/dL    Creatinine Lvl 0.6 mg/dL    AGAP 12 mmol/L  Osmolality Calc 280 mOsm/kg    Calcium Lvl 9.1 mg/dL    eGFR AA 861 fO/fpw/8.26f    eGFR Non-AA 119 mL/min/1.79m   .   Radiology results:  Rad Results (ST)   CT Soft Tissue Neck w/ Contrast  ?  04/08/21 20:02:14  CT NECK WITH CONTRAST    DATE: 04/08/21    INDICATION: soft tissue neck swelling, tooth infection on the left .    TECHNIQUE: Post contrast axial images obtained through the neck, submitted at 3  mm thickness with coronal and sagittal reformations.  CT scanning was performed using radiation dose reduction techniques  when  appropriate, per system protocols    COMPARISON: None.    FINDINGS:  Partially visualized brain unremarkable. Orbits unremarkable. Sinuses clear.    Nasal cavity, nasopharynx, oral cavity, oropharynx, hypopharynx, larynx, parotid  glands, and right segment of the brain unremarkable.    Subcutaneous stranding/edema in the left facial region adjacent to the left  mandibular region. Some soft tissue thickening overlies the left mandible buccal  surface but without discrete rim-enhancing fluid collection/abscess. Findings  may reflect phlegmonous change some periapical lucency surrounding the left  second molar, which may reflect the infection origin. No deep space infection or  airway compromise. Possible mild secondary inflammation of the left  submandibular gland.    Heterogeneously enlarged right thyroid lobe spanning 2.9 x 3.5 cm containing  multiple nodules. Mild resultant leftward tracheal deviation. Left thyroid lobe  unremarkable.    No cervical lymphadenopathy. Visualized lung apices are clear. No concerning  osseous lesion.      IMPRESSION:  1. Left facial subcutaneous stranding/inflammation, likely odontogenic in  origin. Possible phlegmonous change overlying the buccal surface of the left  mandible, but no discrete abscess.    2. Heterogeneously enlarged right thyroid lobe. Routine follow-up thyroid  ultrasound recommended.    Findings were discussed with PA FORRESTER at 8:06 PM on 04/08/2021.  ?  Signed By: STAN JAY  , reviewed radiologist's report.    Notes:  I reviewed patient's past medical record, surgical records, and ED nurses note. , Patient was advised of all test results. Patient is to follow-up with their primary care physician for reevaluation current symptoms if no improvement in one week., Had a lengthy discussion with the patient and family regarding all test results.  I did also discussed the findings of the thyroid nodules.  Patient states she is had evaluation in the past but  has not been evaluated locally.  I will provide endocrinology contact info for follow-up.SABRA       Reexamination/ Reevaluation   Vital signs   Basic Oxygen Information   04/08/2021 18:41 EDT Oxygen Therapy Room air   04/08/2021 17:34 EDT Oxygen Therapy Room air    SpO2 97 %      Course: improving.   Pain status: decreased.   Assessment: exam improved.      Impression and Plan   Diagnosis   Facial cellulitis (ICD10-CM L03.211, Discharge, Medical)   Thyroid nodule (ICD10-CM E04.1, Discharge, Medical)   Dental abscess (ICD10-CM K04.7, Discharge, Medical)   Plan   Condition: Improved, Stable.    Disposition: Discharged: Time  04/08/2021 21:01:00, to home.    Prescriptions: Launch prescriptions   Pharmacy:  Anaprox-DS 550 mg oral tablet (Prescribe): 550 mg, 1 tabs, Oral, BID, for 7 days, PRN: moderate pain (4-7), 14 tabs, 0 Refill(s), continue taking the clindamycin 300 mg 4 times daily as prescribed..    Patient was given the following  educational materials: Dental Pain, Easy-to-Read, Dental Abscess, Easy-to-Read, Cellulitis, Adult, Easy-to-Read, Cellulitis, Adult, Easy-to-Read, Dental Abscess, Easy-to-Read, Dental Pain, Easy-to-Read.    Follow up with: Follow-up with your dentist at [facility] dental tomorrow for reevaluation continue with her current symptoms. In 1 day; Return to Emergency Department Within 1 to 2 days Return to ED if symptoms worsen, Return to Emergency Department Within 1 to 2 days Return to ED if symptoms worsen; Follow-up with your dentist at [facility] dental tomorrow for reevaluation continue with her current symptoms. In 1 day; [name] Within 1 week; [name] Within 1 week; Please call [phone] to find a primary care doctor. Within 1 week.    Counseled: Patient, Regarding diagnosis, Regarding diagnostic results, Regarding treatment plan, Regarding prescription, Patient indicated understanding of instructions.    Notes: I had a lengthy discussion with the patient regarding all test  results. Patient is to follow-up with her primary care physician for reevaluation of current symptoms and continued management. Patient is to follow-up with the ER symptoms worsen or new symptoms develop.SABRA

## 2021-04-08 NOTE — ED Provider Notes (Signed)
Mouth-Dental Pain *ED        Patient:   [name] [name], [name] [name]            MRN: [id number]            FIN: [id number]               Age:   35 years     Sex:  Female     DOB:  07-30-1986   Associated Diagnoses:   Facial cellulitis; Dental abscess; Thyroid nodule   Author:   Ernestina Columbia S-PA      Basic Information   Time seen: Provider Seen (ST)   ED Provider/Time:    Ernestina Columbia S-PA / 04/08/2021 18:36  , Date & time 04/08/2021 18:40:00.   History source: Patient.   Arrival mode: Private vehicle.   History limitation: None.   Additional information: Chief Complaint from Nursing Triage Note   Chief Complaint  Chief Complaint: tooth infection to L side, increased swelling to area that began this afternoon, started on clindamycin this morning (04/08/21 17:34:00).      History of Present Illness   The patient presents with dental pain and mouth pain.  The onset was today.  The course/duration of symptoms is worsening.  Type of injury: none.  The location where the incident occurred was at home.  Location: Left lower gums. The character of symptoms is pain, swelling and redness.  The degree of pain is minimal.  The exacerbating factor is none.  The relieving factor is none.  Risk factors consist of none.  Prior episodes: none.  Therapy today: prescription medications including oral clindamycin.  Associated symptoms: facial Swelling that extends into the neck..  Additional history: Patient presents for evaluation for left lower dental pain and abscess formation.  Patient that she has been having dental work over the past month.  Patient states she saw her dentist this morning who placed her on oral clindamycin due to a small area of swelling along the left lower gumline area.  Patient states she was advised to ER symptoms worsen today.  Patient states that her swelling has increased in severity and now is tracking towards her neck.  On exam there is mild to moderate swelling noted along the left lower gumline that  extends into the anterior neck.  It is mildly tender.  With minimal induration.  No obvious drainage noted.  Patient denies any fever.  Patient's oral airway is patent and is able to tolerate her own oral secretions.  Patient speaks in full sentences.  Patient is not having any difficulty breathing or swallowing.  Patient otherwise is resting comfortably is in no acute distress patient states she did take 2 doses of clindamycin today prior to arrival to the ER.        Review of Systems   Constitutional symptoms:  Negative except as documented in HPI.   Skin symptoms:  Facial swelling along the left lower gumline and anterior neck.   ENMT symptoms:  Mouth: Pain, swelling, dental pain.   Respiratory symptoms:  Negative except as documented in HPI.             Additional review of systems information: All other systems reviewed and otherwise negative, All systems reviewed as documented in chart.      Health Status   Allergies:    Allergic Reactions (Selected)  Mild  Penicillin- Rash..   Medications:  (Selected)   Inpatient Medications  Ordered  Isovue-300: 100 mL, IV  Contrast, Once  Sodium Chloride 0.9% bolus: 1,000 mL, 2000 mL/hr, IV Piggyback, Once  Toradol: 15 mg, 1 mL, IV Push, Once  clindamycin IVPB: 900 mg, IV Piggyback, Once  Documented Medications  Documented  clindamycin: 0 Refill(s).      Past Medical/ Family/ Social History   Surgical history: Reviewed as documented in chart.   Family history: Reviewed as documented in chart.   Social history: Reviewed as documented in chart.   Problem list:    No qualifying data available  , per nurse's notes.      Physical Examination               Vital Signs   Vital Signs   0/86/5784 69:62 EDT Systolic Blood Pressure 952 mmHg  HI    Diastolic Blood Pressure 93 mmHg  HI    Temperature Oral 36.3 degC    Heart Rate Monitored 106 bpm  HI    Respiratory Rate 18 br/min    SpO2 97 %   .   Measurements   04/08/2021 17:36 EDT Body Mass Index est meas 53.07 kg/m2    Body Mass Index  Measured 53.07 kg/m2   04/08/2021 17:34 EDT Height/Length Measured 163 cm    Weight Dosing 141 kg   .   Basic Oxygen Information   04/08/2021 18:41 EDT Oxygen Therapy Room air   04/08/2021 17:34 EDT Oxygen Therapy Room air    SpO2 97 %   .   General:  Alert, no acute distress.    Skin:  Warm, dry, Swelling noted to the left lower jaw and facial area.  This extends into the left anterior neck.  No obvious drainage.  This is concerning for cellulitis with underlying abscess..    Head:  Normocephalic.   Neck:  Supple.   Eye:  Pupils are equal, round and reactive to light, extraocular movements are intact.    Ears, nose, mouth and throat:  Oral mucosa moist, no pharyngeal erythema or exudate.    Cardiovascular:  Regular rate and rhythm.   Respiratory:  Lungs are clear to auscultation, respirations are non-labored.    Chest wall:  No tenderness.   Back:  Nontender, Normal range of motion.    Musculoskeletal:  Normal ROM, normal strength.    Gastrointestinal:  Soft, Nontender.    Neurological:  Alert and oriented to person, place, time, and situation, No focal neurological deficit observed, normal sensory observed, normal motor observed, normal speech observed, normal coordination observed.    Lymphatics:  No lymphadenopathy.   Psychiatric:  Cooperative, appropriate mood & affect.       Medical Decision Making   Differential Diagnosis:  Dental pain, dental abscess, Facial abscess.    Rationale:  PA/NP reviewed with co-signing physician: any ECG, lab results, radiology studies, medication, diagnosis, and plan of care.   Documents reviewed:  Emergency department nurses' notes.   Results review:  Lab results : Lab View   04/08/2021 19:34 EDT Estimated Creatinine Clearance 186.58 mL/min   04/08/2021 19:14 EDT Lactic Acid Lvl 1.2 mmol/L   04/08/2021 19:11 EDT WBC 6.9 x10e3/mcL    RBC 4.05 x10e6/mcL    Hgb 11.4 g/dL  LOW    HCT 35.5 %    MCV 87.7 fL    MCH 28.1 pg    MCHC 32.1 g/dL    RDW 13.3 %    Platelet 294 x10e3/mcL    MPV 10.7  fL    Neutro Auto 63.2 %    Neutro Absolute  4.3 x10e3/mcL    Immature Grans Percent 0.1 %    Immature Grans Absolute 0.01 x10e3/mcL    Lymph Auto 23.9 %    Lymph Absolute 1.6 x10e3/mcL    Mono Auto 7.7 %    Mono Absolute 0.5 x10e3/mcL    Eosinophil Percent 4.4 %    Eos Absolute 0.3 x10e3/mcL    Basophil Auto 0.7 %    Baso Absolute 0.1 x10e3/mcL    Sodium Lvl 140 mmol/L    Potassium Lvl 3.5 mmol/L    Chloride 106 mmol/L    CO2 23 mmol/L    Glucose Random 97 mg/dL    BUN 14 mg/dL    Creatinine Lvl 0.6 mg/dL    AGAP 12 mmol/L    Osmolality Calc 280 mOsm/kg    Calcium Lvl 9.1 mg/dL    eGFR AA 138 mL/min/1.90m???    eGFR Non-AA 119 mL/min/1.76m??   .   Radiology results:  Rad Results (ST)   CT Soft Tissue Neck w/ Contrast  ?  04/08/21 20:02:14  CT NECK WITH CONTRAST    DATE: 04/08/21    INDICATION: soft tissue neck swelling, tooth infection on the left .    TECHNIQUE: Post contrast axial images obtained through the neck, submitted at 3  mm thickness with coronal and sagittal reformations.  CT scanning was performed using radiation dose reduction techniques when  appropriate, per system protocols    COMPARISON: None.    FINDINGS:  Partially visualized brain unremarkable. Orbits unremarkable. Sinuses clear.    Nasal cavity, nasopharynx, oral cavity, oropharynx, hypopharynx, larynx, parotid  glands, and right segment of the brain unremarkable.    Subcutaneous stranding/edema in the left facial region adjacent to the left  mandibular region. Some soft tissue thickening overlies the left mandible buccal  surface but without discrete rim-enhancing fluid collection/abscess. Findings  may reflect phlegmonous change some periapical lucency surrounding the left  second molar, which may reflect the infection origin. No deep space infection or  airway compromise. Possible mild secondary inflammation of the left  submandibular gland.    Heterogeneously enlarged right thyroid lobe spanning 2.9 x 3.5 cm containing  multiple nodules.  Mild resultant leftward tracheal deviation. Left thyroid lobe  unremarkable.    No cervical lymphadenopathy. Visualized lung apices are clear. No concerning  osseous lesion.      IMPRESSION:  1. Left facial subcutaneous stranding/inflammation, likely odontogenic in  origin. Possible phlegmonous change overlying the buccal surface of the left  mandible, but no discrete abscess.    2. Heterogeneously enlarged right thyroid lobe. Routine follow-up thyroid  ultrasound recommended.    Findings were discussed with PA Veldon Wager at 8:06 PM on 04/08/2021.  ?  Signed By: BAElyse Jarvis, reviewed radiologist's report.    Notes:  I reviewed patient's past medical record, surgical records, and ED nurses note. , Patient was advised of all test results. Patient is to follow-up with their primary care physician for reevaluation current symptoms if no improvement in one week., Had a lengthy discussion with the patient and family regarding all test results.  I did also discussed the findings of the thyroid nodules.  Patient states she is had evaluation in the past but has not been evaluated locally.  I will provide endocrinology contact info for follow-up.. Marland Kitchen     Reexamination/ Reevaluation   Vital signs   Basic Oxygen Information   04/08/2021 18:41 EDT Oxygen Therapy Room air   04/08/2021 17:34 EDT Oxygen Therapy Room air  SpO2 97 %      Course: improving.   Pain status: decreased.   Assessment: exam improved.      Impression and Plan   Diagnosis   Facial cellulitis (ICD10-CM L03.211, Discharge, Medical)   Thyroid nodule (ICD10-CM E04.1, Discharge, Medical)   Dental abscess (ICD10-CM K04.7, Discharge, Medical)   Plan   Condition: Improved, Stable.    Disposition: Discharged: Time  04/08/2021 21:01:00, to home.    Prescriptions: [facility] prescriptions   Pharmacy:  Anaprox-DS 550 mg oral tablet (Prescribe): 550 mg, 1 tabs, Oral, BID, for 7 days, PRN: moderate pain (4-7), 14 tabs, 0 Refill(s), continue taking the clindamycin 300 mg 4  times daily as prescribed..    Patient was given the following educational materials: Dental Pain, Easy-to-Read, Dental Abscess, Easy-to-Read, Cellulitis, Adult, Easy-to-Read, Cellulitis, Adult, Easy-to-Read, Dental Abscess, Easy-to-Read, Dental Pain, Easy-to-Read.    Follow up with: Follow-up with your dentist at [place] [facility] tomorrow for reevaluation continue with her current symptoms. In 1 day; Return to Emergency Department Within 1 to 2 days Return to ED if symptoms worsen, Return to Emergency Department Within 1 to 2 days Return to ED if symptoms worsen; Follow-up with your dentist at [place] tomorrow for reevaluation continue with her current symptoms. In 1 day; [name] Within 1 week; Tye Wacissa Within 1 week; Please call [phone] to find a primary care doctor. Within 1 week.    Counseled: Patient, Regarding diagnosis, Regarding diagnostic results, Regarding treatment plan, Regarding prescription, Patient indicated understanding of instructions.    Notes: I had a lengthy discussion with the patient regarding all test results. Patient is to follow-up with her primary care physician for reevaluation of current symptoms and continued management. Patient is to follow-up with the ER symptoms worsen or new symptoms develop.Chemical engineer Line     Electronically Signed on 04/08/2021 09:05 PM EDT   ________________________________________________   Ernestina Columbia S-PA      Electronically Signed on 04/09/2021 04:53 PM EDT   ________________________________________________   Amado Nash            Modified by: Ernestina Columbia S-PA on 04/08/2021 09:05 PM EDT

## 2021-04-08 NOTE — ED Notes (Signed)
ED Triage Note       ED Secondary Triage Entered On:  04/08/2021 18:41 EDT    Performed On:  04/08/2021 18:39 EDT by Lurline Idol L-RN               General Information   Barriers to Learning :   None evident   COVID-19 Vaccine Status :   Not received   ED Home Meds Section :   Document assessment   [facility] ED Fall Risk Section :   Document assessment   ED Advance Directives Section :   Document assessment   ED Palliative Screen :   N/A (prefilled for <65yo)   Lurline Idol L-RN - 04/08/2021 18:39 EDT   (As Of: 04/08/2021 18:41:49 EDT)   Diagnoses(Active)    Facial swelling  Date:   04/08/2021 ; Diagnosis Type:   Reason For Visit ; Confirmation:   Complaint of ; Clinical Dx:   Facial swelling ; Classification:   Medical ; Clinical Service:   Emergency medicine ; Code:   PNED ; Probability:   0 ; Diagnosis Code:   [id number]             -    Procedure History   (As Of: 04/08/2021 18:41:49 EDT)     Anesthesia Minutes:   0 ; Procedure Name:   Arm, right ; Procedure Minutes:   0 ; Last Reviewed Dt/Tm:   04/08/2021 18:41:25 EDT            Anesthesia Minutes:   0 ; Procedure Name:   C-section delivery ; Procedure Minutes:   0 ; Last Reviewed Dt/Tm:   04/08/2021 18:41:33 EDT            Anesthesia Minutes:   0 ; Procedure Name:   Tubal ligation ; Procedure Minutes:   0 ; Last Reviewed Dt/Tm:   04/08/2021 18:41:44 EDT            [facility] Fall Risk Assessment Tool   Hx of falling last 3 months ED Fall :   No   Patient confused or disoriented ED Fall :   No   Patient intoxicated or sedated ED Fall :   No   Patient impaired gait ED Fall :   No   Use a mobility assistance device ED Fall :   No   Patient altered elimination ED Fall :   No   [facility] ED Fall Score :   0    Lurline Idol [facility] - 04/08/2021 18:39 EDT   ED Advance Directive   Advance Directive :   No   Lurline Idol L-RN - 04/08/2021 18:39 EDT   Med Hx   Medication List   (As Of: 04/08/2021 18:41:49 EDT)   Home Meds    clindamycin  :    clindamycin ; Status:   Documented ; Ordered As Mnemonic:   clindamycin ; Simple Display Line:   0 Refill(s) ; Catalog Code:   clindamycin ; Order Dt/Tm:   04/08/2021 18:40:04 EDT

## 2021-04-08 NOTE — ED Notes (Signed)
 ED Patient Summary       ;          [facility]  [address] [place], [place] [address]  [phone]  Discharge Instructions (Patient)  [name] [name], [name] [name]  DOB:  09-12-1986                   MRN: [id number]                   FIN: [phone]  Reason For Visit: Facial swelling; INFECTED TOOTH  Final Diagnosis: Dental abscess; Facial cellulitis; Thyroid nodule     Visit Date: 04/08/2021 17:30:00  Address: [address] [name] [address]  Phone: [phone]     Emergency Department Providers:         Primary Physician:      OLENE COY [facility] would like to thank you for allowing us  to assist you with your healthcare needs. The following includes patient education materials and information regarding your injury/illness.     Follow-up Instructions:  You were seen today on an emergency basis. Please contact your primary care doctor for a follow up appointment. If you received a referral to a specialist doctor, it is important you follow-up as instructed.    It is important that you call your follow-up doctor to schedule and confirm the location of your next appointment. Your doctor may practice at multiple locations. The office location of your follow-up appointment may be different to the one written on your discharge instructions.    If you do not have a primary care doctor, please call [phone] for help in finding a [name] Provider. For help in finding a specialist doctor, please call [phone].    If your condition gets worse before your follow-up with your primary care doctor or specialist, please return to the Emergency Department.      Coronavirus 2019 (COVID-19) Reminders:     Patients age 30 - 69, with parental consent, and over age 53 can make an appointment for a COVID-19 vaccine. Patients can contact their [name] doctors' offices to schedule an appointment to  receive the COVID-19 vaccine. Patients who do not have a [name] physician can call [phone] to schedule vaccination appointments.      Follow Up Appointments:  Primary Care Provider:     Name: PCP,  NONE     Phone:                  With: Address: When:   Please call [phone] to find a primary care doctor.  Within 1 week       With: Address: When:   [name] [name] [address] [place], [place] [address]  [phone] Business (1) Within 1 week       With: Address: When:   [name] [name] [address] [place] [address]  [phone] Business (1) Within 1 week       With: Address: When:   Return to Emergency Department  Within 1 to 2 days   Comments:   Return to ED if symptoms worsen       With: Address: When:   Follow-up with your dentist at [facility] tomorrow for reevaluation continue with her current symptoms.  In 1 day              Post  [employer] SERVICES%>             New Medications  Printed Prescriptions  naproxen (Anaprox-DS 550 mg oral tablet) 1 Tabs Oral (given by mouth) 2 times a day as needed moderate pain (4-7) for 7 Days. Refills: 0.  Last Dose:____________________  Medications that have not changed  Other Medications  clindamycin   Last Dose:____________________      Allergy Info: penicillin     Discharge Additional Information          Discharge Patient 04/08/21 21:06:00 EDT      Patient Education Materials:        Cellulitis, Adult      Cellulitis is a skin infection. The infected area is often warm, red, swollen, and sore. It occurs most often in the arms and lower legs. It is very important to get treated for this condition.      What are the causes?    This condition is caused by bacteria. The bacteria enter through a break in the skin, such as a cut, burn, insect bite, open sore, or crack.      What increases the risk?    This condition is more likely to occur in people who:   Have a weak body defense system (immune system).       Have open cuts, burns, bites, or scrapes on the skin.     Are older than 35 years of age.     Have a blood sugar problem (diabetes).      Have a long-lasting (chronic) liver disease (cirrhosis) or kidney disease.     Are very overweight (obese).     Have a skin problem, such as:  ? Itchy rash (eczema).    ? Slow movement of blood in the veins (venous stasis).    ? Fluid buildup below the skin (edema).       Have been treated with high-energy rays (radiation).     Use IV drugs.         What are the signs or symptoms?    Symptoms of this condition include:   Skin that is:  ? Red.    ? Streaking.    ? Spotting.    ? Swollen.    ? Sore or painful when you touch it.    ? Warm.       A fever.     Chills.      Blisters.        How is this diagnosed?    This condition is diagnosed based on:   Medical history.     Physical exam.     Blood tests.     Imaging tests.        How is this treated?    Treatment for this condition may include:   Medicines to treat infections or allergies.     Home care, such as:  ? Rest.    ? Placing cold or warm cloths (compresses) on the skin.       Hospital care, if the condition is very bad.        Follow these instructions at home:    Medicines     Take over-the-counter and prescription medicines only as told by your doctor.     If you were prescribed an antibiotic medicine, take it as told by your doctor. Do not stop taking it even if you start to feel better.      General instructions  Drink enough fluid to keep your pee (urine) pale yellow.     Do not touch or rub the infected area.     Raise (elevate) the infected area above the level of your heart while you are sitting or lying down.     Place cold or warm cloths on the area as told by your doctor.     Keep all follow-up visits as told by your doctor. This is important.        Contact a doctor if:     You have a fever.     You do not start to get better after 1?2 days of treatment.     Your bone or joint under the infected area  starts to hurt after the skin has healed.     Your infection comes back. This can happen in the same area or another area.     You have a swollen bump in the area.     You have new symptoms.     You feel ill and have muscle aches and pains.      Get help right away if:     Your symptoms get worse.     You feel very sleepy.     You throw up (vomit) or have watery poop (diarrhea) for a long time.     You see red streaks coming from the area.     Your red area gets larger.     Your red area turns dark in color.    These symptoms may represent a serious problem that is an emergency. Do not wait to see if the symptoms will go away. Get medical help right away. Call your local emergency services (911 in the [place] Do not drive yourself to the hospital.      Summary     Cellulitis is a skin infection. The area is often warm, red, swollen, and sore.     This condition is treated with medicines, rest, and cold and warm cloths.     Take all medicines only as told by your doctor.     Tell your doctor if symptoms do not start to get better after 1?2 days of treatment.      This information is not intended to replace advice given to you by your health care provider. Make sure you discuss any questions you have with your health care provider.      Document Revised: 05/06/2018 Document Reviewed: 05/06/2018  Elsevier Patient Education ? 2021 Elsevier Inc.       Dental Abscess      A dental abscess is an area of pus in or around a tooth. It comes from an infection. It can cause pain and other symptoms. Treatment will help with symptoms and prevent the infection from spreading.      Follow these instructions at home:    Medicines     Take over-the-counter and prescription medicines only as told by your dentist.     If you were prescribed an antibiotic medicine, take it as told by your dentist. Do not stop taking it even if you start to feel better.     If you were prescribed a gel that has numbing medicine in it, use it exactly as  told.     Do not drive or use heavy machinery (like a Surveyor, mining) while taking prescription pain medicine.      General instructions       Rinse out your mouth often with  salt water.  ? To make salt water, dissolve ??1 tsp of salt in 1 cup of warm water.       Eat a soft diet while your mouth is healing.     Drink enough fluid to keep your urine pale yellow.     Do not apply heat to the outside of your mouth.     Do not use any products that contain nicotine or tobacco. These include cigarettes and e-cigarettes. If you need help quitting, ask your doctor.     Keep all follow-up visits as told by your dentist. This is important.      Prevent an abscess     Brush your teeth every morning and every night. Use fluoride toothpaste.     Floss your teeth each day.     Get dental cleanings as often as told by your dentist.     Think about getting dental sealant put on teeth that have deep holes (decay).     Drink water that has fluoride in it.  ? Most tap water has fluoride.    ? Check the label on bottled water to see if it has fluoride in it.       Drink water instead of sugary drinks.      Eat healthy meals and snacks.      Wear a mouth guard or face shield when you play sports.        Contact a doctor if:     Your pain is worse, and medicine does not help.      Get help right away if:     You have a fever or chills.     Your symptoms suddenly get worse.     You have a very bad headache.     You have problems breathing or swallowing.     You have trouble opening your mouth.     You have swelling in your neck or close to your eye.      Summary     A dental abscess is an area of pus in or around a tooth. It is caused by an infection.     Treatment will help with symptoms and prevent the infection from spreading.     Take over-the-counter and prescription medicines only as told by your dentist.     To prevent an abscess, take good care of your teeth. Brush your teeth every morning and night. Use floss every day.      Get  dental cleanings as often as told by your dentist.      This information is not intended to replace advice given to you by your health care provider. Make sure you discuss any questions you have with your health care provider.      Document Revised: 07/06/2020 Document Reviewed: 07/06/2020  Elsevier Patient Education ? 2021 Elsevier Inc.       Dental Pain      Dental pain may be caused by many things, including:   Tooth decay (cavities or caries).     Infection.     The inner part of the tooth being filled with pus (abscess).     Injury.      Sometimes the cause of pain is unknown.    Your pain can vary. It may be mild or severe. You may have it all the time, or it may occur only when you are:   Chewing.     Exposed to hot or cold temperature.  Eating or drinking sugary foods or beverages, such as soda or candy.        Follow these instructions at home:    Medicines     Take over-the-counter and prescription medicines only as told by your doctor.     If you were prescribed an antibiotic medicine, take it as told by your doctor. Do not stop taking the medicine even if you start to feel better.      Eating and drinking     Do not eat foods or drinks that cause you pain. These include:  ? Very hot or very cold foods or drinks.    ? Sweet or sugary foods or drinks.        Managing pain and swelling       Gargle with a salt-water mixture 3?4 times a day. To make this, dissolve ??1 tsp of salt in 1 cup of warm water.     If told, put ice on the painful area of your face:  ? Put ice in a plastic bag.     ? Place a towel between your skin and the bag.     ? Leave the ice on for 20 minutes, 2?3 times a day.        Brushing your teeth     Brush your teeth twice a day using a fluoride toothpaste.      Floss your teeth once a day.     Use a toothpaste made for sensitive teeth as told by your doctor.     Use a soft toothbrush.      General instructions     Do not apply heat to the outside of your face.     Watch your dental  pain. Let your doctor know if there are any changes.     Keep all follow-up visits as told by your doctor. This is important.        Contact a doctor if:     Your pain is not relieved by medicines.     You have new symptoms.     Your symptoms get worse.      Get help right away if:     You cannot open your mouth.     You are having trouble breathing or swallowing.     You have a fever.     Your face, neck, or jaw is swollen.      Summary     Dental pain may be caused by many things, including tooth decay, injury, or infection. In some cases, the cause is not known.     Your pain may be mild or severe. You may have pain all the time, or you may have it only when you eat or drink.     Take over-the-counter and prescription medicines only as told by your doctor.     Watch your dental pain for any changes. Let your doctor know if symptoms get worse.      This information is not intended to replace advice given to you by your health care provider. Make sure you discuss any questions you have with your health care provider.      Document Revised: 04/12/2019 Document Reviewed: 12/28/2017  Elsevier Patient Education ? 2021 Elsevier Inc.      ---------------------------------------------------------------------------------------------------------------------  [facility] allows patients to review your COVID and other test results as well as discharge documents from any [name], Emergency Department, surgical center or outpatient lab. Test results  are typically available 36 hours after the test is completed.     [name] encourages you to self-enroll in the [facility] Patient Portal.     To begin your self-enrollment process, please visit [email or web address] Under [facility], click on "Sign up now".     NOTE: You must be 16 years and older to use [facility] Self-Enroll online. If you are a parent, caregiver, or guardian; you need an invite to  access your child's or dependent's health records. To obtain an invite, contact the Medical Records department at [phone] Monday through Friday, 8-4:30, select option 3 . If we receive your call afterhours, we will return your call the next business day.     If you have issues trying to create or access your account, contact [employer] support at [phone] available 7 days a week 24 hours a day.     Comment:

## 2021-04-08 NOTE — ED Notes (Signed)
ED Triage Note       ED Triage Adult Entered On:  04/08/2021 17:36 EDT    Performed On:  04/08/2021 17:34 EDT by Timothy Lasso, RN, Helen A               Triage   Numeric Rating Pain Scale :   7   Chief Complaint :   tooth infection to L side, increased swelling to area that began this afternoon, started on clindamycin this morning   [place] Mode of Arrival :   Private vehicle   Infectious Disease Documentation :   Document assessment   Temperature Oral :   36.3 degC(Converted to: 97.3 degF)    Heart Rate Monitored :   106 bpm (HI)    Respiratory Rate :   18 br/min   Systolic Blood Pressure :   154 mmHg (HI)    Diastolic Blood Pressure :   93 mmHg (HI)    SpO2 :   97 %   Oxygen Therapy :   Room air   Patient presentation :   HR > 100   Chief Complaint or Presentation suggest infection :   Yes   Dosing Weight Obtained By :   Patient stated   Weight Dosing :   141 kg(Converted to: 310 lb 14 oz)    Height :   163 cm(Converted to: 5 ft 4 in)    Body Mass Index Dosing :   53 kg/m2   Sherri Rad A - 04/08/2021 17:34 EDT   DCP GENERIC CODE   Tracking Acuity :   3   Tracking Group :   ED [facility] [employer]   [place], RNLemont Fillers - 04/08/2021 17:34 EDT   ED General Section :   Document assessment   Pregnancy Status :   Patient denies   ED Allergies Section :   Document assessment   ED Reason for Visit Section :   Document assessment   Velna Hatchet - 04/08/2021 17:34 EDT   ID Risk Screen Symptoms   Recent Travel History :   No recent travel   Close Contact with COVID-19 ID :   No   Last 14 days COVID-19 ID :   No   TB Symptom Screen :   No symptoms   C. diff Symptom/History ID :   Neither of the above   Timothy Lasso, Research scientist (life sciences) A - 04/08/2021 17:34 EDT   Allergies   (As Of: 04/08/2021 17:36:30 EDT)   Allergies (Active)   penicillin  Estimated Onset Date:   Unspecified ; Reactions:   Rash ; Created By:   Thane Edu C-RN; Reaction Status:   Active ; Category:   Drug ; Substance:   penicillin ; Type:   Allergy ; Severity:   Mild  ; Updated By:   Curly Rim; Reviewed Date:   04/08/2021 17:36 EDT        Psycho-Social   Last 3 mo, thoughts killing self/others :   Patient denies   Right click within box for Suspected Abuse policy link. :   None   Feels Safe Where Live :   Yes   Velna Hatchet - 04/08/2021 17:34 EDT   ED Reason for Visit   (As Of: 04/08/2021 17:36:30 EDT)   Diagnoses(Active)    Facial swelling  Date:   04/08/2021 ; Diagnosis Type:   Reason For Visit ; Confirmation:   Complaint of ; Clinical Dx:  Facial swelling ; Classification:   Medical ; Clinical Service:   Emergency medicine ; Code:   PNED ; Probability:   0 ; Diagnosis Code:   [id number]

## 2021-04-08 NOTE — ED Notes (Signed)
ED Notes               Report to [facility], RN. Care transferred  [employer]

## 2021-04-08 NOTE — ED Notes (Signed)
ED Patient Education Note     Patient Education Materials Follows:  Dentistry     Dental Abscess      A dental abscess is an area of pus in or around a tooth. It comes from an infection. It can cause pain and other symptoms. Treatment will help with symptoms and prevent the infection from spreading.      Follow these instructions at home:    Medicines     Take over-the-counter and prescription medicines only as told by your dentist.     If you were prescribed an antibiotic medicine, take it as told by your dentist. Do not stop taking it even if you start to feel better.     If you were prescribed a gel that has numbing medicine in it, use it exactly as told.     Do not drive or use heavy machinery (like a [employer], mining) while taking prescription pain medicine.      General instructions       Rinse out your mouth often with salt water.  ? To make salt water, dissolve ??1 tsp of salt in 1 cup of warm water.       Eat a soft diet while your mouth is healing.     Drink enough fluid to keep your urine pale yellow.     Do not apply heat to the outside of your mouth.     Do not use any products that contain nicotine or tobacco. These include cigarettes and e-cigarettes. If you need help quitting, ask your doctor.     Keep all follow-up visits as told by your dentist. This is important.      Prevent an abscess     Brush your teeth every morning and every night. Use fluoride toothpaste.     Floss your teeth each day.     Get dental cleanings as often as told by your dentist.     Think about getting dental sealant put on teeth that have deep holes (decay).     Drink water that has fluoride in it.  ? Most tap water has fluoride.    ? Check the label on bottled water to see if it has fluoride in it.       Drink water instead of sugary drinks.      Eat healthy meals and snacks.      Wear a mouth guard or face shield when you play sports.        Contact a doctor if:     Your pain is worse, and medicine does not help.      Get help  right away if:     You have a fever or chills.     Your symptoms suddenly get worse.     You have a very bad headache.     You have problems breathing or swallowing.     You have trouble opening your mouth.     You have swelling in your neck or close to your eye.      Summary     A dental abscess is an area of pus in or around a tooth. It is caused by an infection.     Treatment will help with symptoms and prevent the infection from spreading.     Take over-the-counter and prescription medicines only as told by your dentist.     To prevent an abscess, take good care of your teeth. Brush your teeth every morning and night.  Use floss every day.      Get dental cleanings as often as told by your dentist.      This information is not intended to replace advice given to you by your health care provider. Make sure you discuss any questions you have with your health care provider.      Document Revised: 07/06/2020 Document Reviewed: 07/06/2020  Elsevier Patient Education ? 2021 Elsevier Inc.         Dental Pain      Dental pain may be caused by many things, including:   Tooth decay (cavities or caries).     Infection.     The inner part of the tooth being filled with pus (abscess).     Injury.      Sometimes the cause of pain is unknown.    Your pain can vary. It may be mild or severe. You may have it all the time, or it may occur only when you are:   Chewing.     Exposed to hot or cold temperature.     Eating or drinking sugary foods or beverages, such as soda or candy.        Follow these instructions at home:    Medicines     Take over-the-counter and prescription medicines only as told by your doctor.     If you were prescribed an antibiotic medicine, take it as told by your doctor. Do not stop taking the medicine even if you start to feel better.      Eating and drinking     Do not eat foods or drinks that cause you pain. These include:  ? Very hot or very cold foods or drinks.    ? Sweet or sugary foods or drinks.         Managing pain and swelling       Gargle with a salt-water mixture 3?4 times a day. To make this, dissolve ??1 tsp of salt in 1 cup of warm water.     If told, put ice on the painful area of your face:  ? Put ice in a plastic bag.     ? Place a towel between your skin and the bag.     ? Leave the ice on for 20 minutes, 2?3 times a day.        Brushing your teeth     Brush your teeth twice a day using a fluoride toothpaste.      Floss your teeth once a day.     Use a toothpaste made for sensitive teeth as told by your doctor.     Use a soft toothbrush.      General instructions     Do not apply heat to the outside of your face.     Watch your dental pain. Let your doctor know if there are any changes.     Keep all follow-up visits as told by your doctor. This is important.        Contact a doctor if:     Your pain is not relieved by medicines.     You have new symptoms.     Your symptoms get worse.      Get help right away if:     You cannot open your mouth.     You are having trouble breathing or swallowing.     You have a fever.     Your face, neck, or jaw is swollen.  Summary     Dental pain may be caused by many things, including tooth decay, injury, or infection. In some cases, the cause is not known.     Your pain may be mild or severe. You may have pain all the time, or you may have it only when you eat or drink.     Take over-the-counter and prescription medicines only as told by your doctor.     Watch your dental pain for any changes. Let your doctor know if symptoms get worse.      This information is not intended to replace advice given to you by your health care provider. Make sure you discuss any questions you have with your health care provider.      Document Revised: 04/12/2019 Document Reviewed: 12/28/2017  Elsevier Patient Education ? 2021 Elsevier Inc.      Infectious Disease     Cellulitis, Adult      Cellulitis is a skin infection. The infected area is often warm, red, swollen, and sore. It  occurs most often in the arms and lower legs. It is very important to get treated for this condition.      What are the causes?    This condition is caused by bacteria. The bacteria enter through a break in the skin, such as a cut, burn, insect bite, open sore, or crack.      What increases the risk?    This condition is more likely to occur in people who:   Have a weak body defense system (immune system).      Have open cuts, burns, bites, or scrapes on the skin.     Are older than 35 years of age.     Have a blood sugar problem (diabetes).      Have a long-lasting (chronic) liver disease (cirrhosis) or kidney disease.     Are very overweight (obese).     Have a skin problem, such as:  ? Itchy rash (eczema).    ? Slow movement of blood in the veins (venous stasis).    ? Fluid buildup below the skin (edema).       Have been treated with high-energy rays (radiation).     Use IV drugs.         What are the signs or symptoms?    Symptoms of this condition include:   Skin that is:  ? Red.    ? Streaking.    ? Spotting.    ? Swollen.    ? Sore or painful when you touch it.    ? Warm.       A fever.     Chills.      Blisters.        How is this diagnosed?    This condition is diagnosed based on:   Medical history.     Physical exam.     Blood tests.     Imaging tests.        How is this treated?    Treatment for this condition may include:   Medicines to treat infections or allergies.     Home care, such as:  ? Rest.    ? Placing cold or warm cloths (compresses) on the skin.       Hospital care, if the condition is very bad.        Follow these instructions at home:    Medicines     Take over-the-counter and prescription medicines only as told  by your doctor.     If you were prescribed an antibiotic medicine, take it as told by your doctor. Do not stop taking it even if you start to feel better.      General instructions       Drink enough fluid to keep your pee (urine) pale yellow.     Do not touch or rub the infected  area.     Raise (elevate) the infected area above the level of your heart while you are sitting or lying down.     Place cold or warm cloths on the area as told by your doctor.     Keep all follow-up visits as told by your doctor. This is important.        Contact a doctor if:     You have a fever.     You do not start to get better after 1?2 days of treatment.     Your bone or joint under the infected area starts to hurt after the skin has healed.     Your infection comes back. This can happen in the same area or another area.     You have a swollen bump in the area.     You have new symptoms.     You feel ill and have muscle aches and pains.      Get help right away if:     Your symptoms get worse.     You feel very sleepy.     You throw up (vomit) or have watery poop (diarrhea) for a long time.     You see red streaks coming from the area.     Your red area gets larger.     Your red area turns dark in color.    These symptoms may represent a serious problem that is an emergency. Do not wait to see if the symptoms will go away. Get medical help right away. Call your local emergency services (911 in the [place] Do not drive yourself to the hospital.      Summary     Cellulitis is a skin infection. The area is often warm, red, swollen, and sore.     This condition is treated with medicines, rest, and cold and warm cloths.     Take all medicines only as told by your doctor.     Tell your doctor if symptoms do not start to get better after 1?2 days of treatment.      This information is not intended to replace advice given to you by your health care provider. Make sure you discuss any questions you have with your health care provider.      Document Revised: 05/06/2018 Document Reviewed: 05/06/2018  Elsevier Patient Education ? 2021 Elsevier Inc.

## 2021-04-14 LAB — CULTURE, BLOOD 1

## 2021-05-01 NOTE — ED Notes (Signed)
 ED Patient Summary       ;          [facility]  [address] [place], [place] [address]  [phone]  Discharge Instructions (Patient)  RUSTI, ARIZMENDI  DOB:  Jun 06, 1986                   MRN: [id number]                   FIN: [phone]  Reason For Visit: Trunk / Chest injury; WK COMP/COFFEE GRINDER FELL ON CHEST  Final Diagnosis: Chest wall contusion     Visit Date: 05/01/2021 10:29:00  Address: [address] [place] [facility] [address]  Phone: [phone]     Emergency Department Providers:         Primary Physician:      MELDA BURNARD BRAVO      [facility] would like to thank you for allowing us  to assist you with your healthcare needs. The following includes patient education materials and information regarding your injury/illness.     Follow-up Instructions:  You were seen today on an emergency basis. Please contact your primary care doctor for a follow up appointment. If you received a referral to a specialist doctor, it is important you follow-up as instructed.    It is important that you call your follow-up doctor to schedule and confirm the location of your next appointment. Your doctor may practice at multiple locations. The office location of your follow-up appointment may be different to the one written on your discharge instructions.    If you do not have a primary care doctor, please call [phone] for help in finding a [name] Provider. For help in finding a specialist doctor, please call [phone].    If your condition gets worse before your follow-up with your primary care doctor or specialist, please return to the Emergency Department.      Coronavirus 2019 (COVID-19) Reminders:     Patients age 55 - 21, with parental consent, and over age 85 can make an appointment for a COVID-19 vaccine. Patients can contact their [name] doctors' offices to schedule an appointment to receive  the COVID-19 vaccine. Patients who do not have a [name] physician can call [phone] to schedule vaccination appointments.      Follow Up Appointments:  Primary Care Provider:     Name: PCP,  NONE     Phone:                  With: Address: When:   PCP  Within 1 week   Comments:   Please follow-up with your primary care provider. If you do not have a PCP, you can call [phone]. Return to the ED immediately if symptoms worsen or new symptoms develop.                 [facility] SERVICES%>             New Medications  Printed Prescriptions  ibuprofen (ibuprofen 800 mg oral tablet) 1 Tabs Oral (given by mouth) 3 times a day as needed moderate pain (4-7) for 3 Days. Refills: 0.  Last Dose:____________________  Medications that have not changed  Other Medications  clindamycin   Last Dose:____________________  naproxen (naproxen sodium 550 mg oral tablet) 1 Tabs Oral (given by mouth) 2 times a day as needed for  pain.  Last Dose:____________________      Allergy Info: penicillin     Discharge Additional Information          Discharge Patient 05/01/21 11:57:00 EDT      Patient Education Materials:        Chest Wall Pain    Chest wall pain is pain in or around the bones and muscles of your chest. Sometimes, an injury causes this pain. Excessive coughing or overuse of arm and chest muscles may also cause chest wall pain. Sometimes, the cause may not be known. This pain may take several weeks or longer to get better.      Follow these instructions at home:    Managing pain, stiffness, and swelling       If directed, put ice on the painful area:  ? Put ice in a plastic bag.    ? Place a towel between your skin and the bag.    ? Leave the ice on for 20 minutes, 2?3 times per day.        Activity     Rest as told by your health care provider.     Avoid activities that cause pain. These include any activities that use your chest muscles or your abdominal and side muscles to lift heavy items.  Ask your health care provider what activities are safe for you.      General instructions       Take over-the-counter and prescription medicines only as told by your health care provider.     Do not use any products that contain nicotine or tobacco, such as cigarettes, e-cigarettes, and chewing tobacco. These can delay healing after injury. If you need help quitting, ask your health care provider.     Keep all follow-up visits as told by your health care provider. This is important.        Contact a health care provider if:     You have a fever.     Your chest pain becomes worse.     You have new symptoms.      Get help right away if:     You have nausea or vomiting.     You feel sweaty or light-headed.     You have a cough with mucus from your lungs (sputum) or you cough up blood.     You develop shortness of breath.    These symptoms may represent a serious problem that is an emergency. Do not wait to see if the symptoms will go away. Get medical help right away. Call your local emergency services (911 in the [place] Do not drive yourself to the hospital.      Summary     Chest wall pain is pain in or around the bones and muscles of your chest.     Depending on the cause, it may be treated with ice, rest, medicines, and avoiding activities that cause pain.     Contact a health care provider if you have a fever, worsening chest pain, or new symptoms.     Get help right away if you feel light-headed or you develop shortness of breath. These symptoms may be an emergency.      This information is not intended to replace advice given to you by your health care provider. Make sure you discuss any questions you have with your health care provider.      Document Revised: 06/17/2018 Document Reviewed: 06/17/2018  Elsevier Patient Education ? 2021 Elsevier  Inc.       Blunt Chest Trauma    Blunt chest trauma is an injury that is caused by a hard, direct hit to the chest. The hit can be strong enough to injure multiple body  parts and organs. Blunt trauma does not involve a puncture of the skin.    Blunt chest trauma often results in bruised or broken (fractured) ribs. In many cases, the soft tissue in the chest wall is also injured, and that damage causes pain and bruising. Internal organs, such as the heart and lungs, can be injured as well.    Blunt chest trauma can lead to serious medical problems. This injury requires immediate medical care.      What are the causes?    This condition may be caused by:   Motor vehicle collisions.     Falls.     Physical violence.     Sports injuries.        What are the signs or symptoms?    Symptoms of this condition include:   Chest pain. The pain may be worse when you breathe deeply or move.     Shortness of breath.     Light-headedness.     Bruising.     Tenderness.     Swelling.     Feeling as if your heart is racing.        How is this diagnosed?    This condition is diagnosed based on your medical history and a physical exam. You may also have imaging tests, including:   X-ray.     CT scan.     MRI.     Ultrasound.        How is this treated?    Treatment for this condition depends on your injury type and severity of the injury. You may need to stay in the hospital. Treatment may include:   Pain medicine. You may be given pain medicine through an IV at first. You may also be given over-the-counter and prescription pain medicines.     Tubes or other devices, such as an incentive spirometer, to help you breathe.     Inserting a tube into your chest to drain excess fluid, blood, or air.     Fluid or blood transfusions through an IV.     Surgery to repair broken ribs and fix damaged tissue and organs.     Lung (pulmonary) rehabilitation. This may include exercises, counseling, or support groups.        Follow these instructions at home:    Medicines     Take over-the-counter and prescription medicines only as told by your health care provider.     Ask your health care provider if the medicine  prescribed to you:   ? Requires you to avoid driving or using machinery.    ? Can cause constipation. You may need to take these actions to prevent or treat constipation:  ? Drink enough fluid to keep your urine pale yellow.     ? Take over-the-counter or prescription medicines.    ? Eat foods that are high in fiber, such as beans, whole grains, and fresh fruits and vegetables.    ? Limit foods that are high in fat and processed sugars, such as fried or sweet foods.           Managing pain and swelling      If directed, put ice on the injured area. To do this:   Put ice  in a plastic bag.     Place a towel between your skin and the bag.     Leave the ice on for 20 minutes, 2?3 times a day.     Remove the ice if your skin turns bright red. This is very important. If you cannot feel pain, heat, or cold, you have a greater risk of damage to the area.        Activity       Do not lift anything that is heavier than 10 lb (4.5 kg), or the limit that you are told, until your health care provider says that it is safe.     Return to your normal activities as told by your health care provider. Ask your health care provider what activities are safe for you.     Do exercises as told by your health care provider.      General instructions     Do not use any products that contain nicotine or tobacco, such as cigarettes, e-cigarettes, and chewing tobacco. These can delay healing. If you need help quitting, ask your health care provider.     Use your incentive spirometer as told to help you take deep breaths.     Consider joining a support group. This may help you learn about your condition and techniques to help with recovery.     Keep all follow-up visits. This is important. Follow-up visits may include counseling.        Contact a health care provider if:     Your pain gets worse.     You develop a cough or wheezing.      Get help right away if:     You experience any of the following:  ? Shortness of breath.    ? Pain in your  abdomen.    ? Nausea or vomiting.    ? A fever or chills.    ? A rapid heart rate.       You cough up blood.     You feel dizzy or weak.     You faint.     You have severe chest pain. This may come with other symptoms, such as:  ? Dizziness.    ? Shortness of breath.    ? Pain in your neck, jaw, or back, or in one arm or both arms.      These symptoms may represent a serious problem that is an emergency. Do not wait to see if the symptoms will go away. Get medical help right away. Call your local emergency services (911 in the [place] Do not drive yourself to the hospital.      Summary     Blunt chest trauma is an injury that is caused by a hard, direct hit to the chest. It may involve broken ribs, damage to the chest wall, and injury to internal organs such as the heart and lungs.     Blunt chest trauma can lead to serious medical problems. This injury requires immediate medical care.     Symptoms may include chest pain when moving or taking deep breaths, shortness of breath, bruising or swelling of the chest, faster heartbeat, and light-headedness.     Treatment for this condition depends on what type of injury you have and how severe it is.     Make sure you know which symptoms should cause you to get help right away.      This information is not intended to replace advice  given to you by your health care provider. Make sure you discuss any questions you have with your health care provider.      Document Revised: 03/21/2020 Document Reviewed: 03/21/2020  Elsevier Patient Education ? 2021 Elsevier Inc.      ---------------------------------------------------------------------------------------------------------------------  [facility] allows patients to review your COVID and other test results as well as discharge documents from any [name], Emergency Department, surgical center or outpatient lab. Test results are typically available 36 hours after the test is completed.      [name] encourages you to self-enroll in the [facility] Patient Portal.     To begin your self-enrollment process, please visit [email or web address] Under [facility], click on "Sign up now".     NOTE: You must be 16 years and older to use [facility] Self-Enroll online. If you are a parent, caregiver, or guardian; you need an invite to access your child's or dependent's health records. To obtain an invite, contact the Medical Records department at [phone] Monday through Friday, 8-4:30, select option 3 . If we receive your call afterhours, we will return your call the next business day.     If you have issues trying to create or access your account, contact [employer] support at [phone] available 7 days a week 24 hours a day.     Comment:

## 2021-05-01 NOTE — Discharge Summary (Signed)
ED Clinical Summary                         [facility]  [address]  [place], [place], [address]  [phone]           PERSON INFORMATION  Name: [name] [name], [name] [name] Age:  35 Years DOB: 1986/04/09   Sex: Female Language: English PCP: PCP,  NONE   Marital Status:  Married Phone: [phone] Med Service: MED-Medicine   MRN:  [id number] Acct# [id number] Arrival: 05/01/2021 10:29:00   Visit Reason: Trunk / Chest injury; WK COMP/COFFEE GRINDER FELL ON CHEST Acuity: 4 LOS: 000 01:32   Address:      [address] [facility] [place] [address]  Diagnosis:      Chest wall contusion  Printed Prescriptions:            Allergies      penicillin (Rash)      Medications Administered During Visit:                  Medication Dose Route   ketorolac 15 mg IM       Patient Medication List:              clindamycin  ibuprofen (ibuprofen 800 mg oral tablet) 1 Tabs Oral (given by mouth) 3 times a day as needed moderate pain (4-7) for 3 Days. Refills: 0.  naproxen (naproxen sodium 550 mg oral tablet) 1 Tabs Oral (given by mouth) 2 times a day as needed for pain.         Major Tests and Procedures:  The following procedures and tests were performed during your ED visit.  COMMONPROCEDURES%>  COMMON PROCEDURESCOMMENTS%>          Laboratory Orders  No laboratory orders were placed.              Radiology Orders  Name Status Details   XR Ribs w/ PA Chest Bilateral Completed 05/01/21 11:17:00 EDT, STAT 1 hour or less, Reason: Chest pain, Transport Mode: STRETCHER, pp_set_radiology_subspecialty               Patient Care Orders  Name Status Details   Discharge Patient Ordered 05/01/21 11:57:00 EDT   ED Assessment Adult Completed 05/01/21 10:49:37 EDT, 05/01/21 10:49:37 EDT   ED Secondary Triage Completed 05/01/21 10:49:37 EDT, 05/01/21 10:49:37 EDT   ED Triage Adult Completed 05/01/21 10:30:00 EDT, 05/01/21 10:30:00 EDT   Employment-related accident Ordered 05/01/21 10:30:00 EDT, 05/01/21 10:30:00  EDT             PROVIDER INFORMATION               Provider Role Assigned Trenton Gammon [facility] ED MidLevel 05/01/2021 10:51:52    Johnnye Sima ED Nurse 05/01/2021 10:59:20        Attending Physician:  Kevan Ny E-PAC     Admit Doc  Kevan Ny E-PAC     Consulting Doc       VITALS INFORMATION  Vital Sign Triage Latest   Temp Oral ORAL_1%>36.7 degC ORAL%>36.8 degC   Temp Temporal TEMPORAL_1%> TEMPORAL%>   Temp Intravascular INTRAVASCULAR_1%> INTRAVASCULAR%>   Temp Axillary AXILLARY_1%> AXILLARY%>   Temp Rectal RECTAL_1%> RECTAL%>   02 Sat 98 % 99 %   Respiratory Rate RATE_1%>18 br/min RATE%>16 br/min   Peripheral Pulse Rate PULSE RATE_1%> PULSE RATE%>   Apical Heart Rate HEART RATE_1%> HEART  RATE%>   Blood Pressure BLOOD PRESSURE_1%>/ BLOOD PRESSURE_1%>98 mmHg BLOOD PRESSURE%>138 mmHg / BLOOD PRESSURE%>84 mmHg                 Immunizations      No Immunizations Documented This Visit          DISCHARGE INFORMATION   Discharge Disposition: H Outpt-Sent Home   Discharge Location:    Home   Discharge Date and Time:    05/01/2021 12:01:46   ED Checkout Date and Time:    05/01/2021 12:01:46     DEPART REASON INCOMPLETE INFORMATION               Depart Action Incomplete Reason   Interactive View/I&O Recently assessed               Problems      No Problems Documented              Smoking Status      No Smoking Status Documented         PATIENT EDUCATION INFORMATION  Instructions:       Chest Wall Pain; Blunt Chest Trauma     Follow up:                    With: Address: When:   PCP  Within 1 week   Comments:   Please follow-up with your primary care provider. If you do not have a PCP, you can call [phone]. Return to the ED immediately if symptoms worsen or new symptoms develop.              ED PROVIDER DOCUMENTATION

## 2021-05-01 NOTE — ED Notes (Signed)
ED Triage Note       ED Secondary Triage Entered On:  05/01/2021 11:00 EDT    Performed On:  05/01/2021 11:00 EDT by Marcelle Smiling, RN, Darrick Huntsman Information   Barriers to Learning :   None evident   Languages :   English   Last Tetanus :   Greater than 5 years   Pt. Currently Receiving Radiation :   No   ED Home Meds Section :   Document assessment   [facility] ED Fall Risk Section :   Document assessment   ED Advance Directives Section :   Document assessment   ED Palliative Screen :   N/A (prefilled for <65yo)   Marcelle Smiling, RNRayna Sexton - 05/01/2021 11:00 EDT   (As Of: 05/01/2021 11:00:56 EDT)   Diagnoses(Active)    Trunk / Chest injury  Date:   05/01/2021 ; Diagnosis Type:   Reason For Visit ; Confirmation:   Complaint of ; Clinical Dx:   Trunk / Chest injury ; Classification:   Medical ; Clinical Service:   Emergency medicine ; Code:   PNED ; Probability:   0 ; Diagnosis Code:   [id number]             -    Procedure History   (As Of: 05/01/2021 11:00:56 EDT)     Anesthesia Minutes:   0 ; Procedure Name:   Arm, right ; Procedure Minutes:   0            Anesthesia Minutes:   0 ; Procedure Name:   C-section delivery ; Procedure Minutes:   0            Anesthesia Minutes:   0 ; Procedure Name:   Tubal ligation ; Procedure Minutes:   0            [facility] Fall Risk Assessment Tool   Hx of falling last 3 months ED Fall :   No   Patient confused or disoriented ED Fall :   No   Patient intoxicated or sedated ED Fall :   No   Patient impaired gait ED Fall :   No   Use a mobility assistance device ED Fall :   No   Patient altered elimination ED Fall :   No   [facility] ED Fall Score :   0    Johnnye Sima - 05/01/2021 11:00 EDT   ED Advance Directive   Advance Directive :   No   Marcelle Smiling RN, Rayna Sexton - 05/01/2021 11:00 EDT   Med Hx   Medication List   (As Of: 05/01/2021 11:00:56 EDT)   Home Meds    naproxen  :   naproxen ; Status:   Documented ; Ordered As Mnemonic:   naproxen sodium 550 mg oral tablet ; Simple  Display Line:   550 mg, 1 tabs, Oral, BID, PRN: for pain, 20 tabs, 0 Refill(s) ; Catalog Code:   naproxen ; Order Dt/Tm:   05/01/2021 11:00:39 EDT          clindamycin  :   clindamycin ; Status:   Documented ; Ordered As Mnemonic:   clindamycin ; Simple Display Line:   0 Refill(s) ; Catalog Code:   clindamycin ; Order Dt/Tm:   04/08/2021 18:40:04 EDT

## 2021-05-01 NOTE — ED Provider Notes (Signed)
Trunk / Chest injury        Patient:   [name] [name], [name] [name]            MRN: [id number]            FIN: [id number]               Age:   35 years     Sex:  Female     DOB:  05-07-1986   Associated Diagnoses:   Chest wall contusion   Author:   Kevan Ny E-PAC      Basic Information   Time seen: Provider Seen (ST)   ED Provider/Time:    Kevan Ny E-PAC / 05/01/2021 10:51  .   Additional information: Chief Complaint from Nursing Triage Note   Chief Complaint  Chief Complaint: wk comp claim hurt on job Sunday night, coffee grinder fell into chest then had to lift into place, chest has a "annoying boom-boom-boom, and into shoulder", few headaches, took tyl, walked in no distress (05/01/21 10:46:00).      History of Present Illness   35 year old female with no significant past medical history presents to the ED with concern for chest wall pain for 3 days.  She states that she works as a Mudlogger at [employer] and a few days ago at work a Statistician slid off a shelf and hit her in the chest when she was trying to move it.  Since then has had some mild anterior chest pain and was told by her Workmen's Comp representative to come to the ER for evaluation.  Pain is some mild anterior chest wall tenderness to palpation and slithly worse when twisting.  Described as aching, 3/10 severity.  No radiation of pain.  Pain is not exertional or pleuritic.  No cough, shortness of breath, hemoptysis, peripheral edema.  No history of DVT.  No fever or chills.  No headache or body aches.  No neck or back pain.  No abdominal pain, nausea, vomiting..        Review of Systems   Constitutional symptoms:  No fever, no chills, no fatigue.    Skin symptoms:  No rash, no lesion.    Eye symptoms:  Vision unchanged, No discharge,    Respiratory symptoms:  No shortness of breath, no cough, no hemoptysis, no wheezing.    Cardiovascular symptoms:  Chest pain, no palpitations, no peripheral edema.    Gastrointestinal  symptoms:  No abdominal pain, no nausea, no vomiting, no diarrhea, no constipation.    Genitourinary symptoms:  No dysuria, no hematuria.    Musculoskeletal symptoms:  No back pain,    Neurologic symptoms:  No headache, no dizziness, no numbness, no tingling, no focal weakness.              Additional review of systems information: All other systems reviewed and otherwise negative.      Health Status   Allergies:    Allergic Reactions (Selected)  Mild  Penicillin- Rash..   Medications:  (Selected)   Documented Medications  Documented  clindamycin: 0 Refill(s)  naproxen sodium 550 mg oral tablet: 550 mg, 1 tabs, Oral, BID, PRN: for pain, 20 tabs, 0 Refill(s).      Past Medical/ Family/ Social History   Medical history: Reviewed as documented in chart.   Surgical history: Reviewed as documented in chart.   Family history: Reviewed as documented in chart.   Social history: Reviewed as documented in chart.  Problem list: Per nurse's notes.      Physical Examination               Vital Signs   Vital Signs   05/01/2021 10:46 EDT Systolic Blood Pressure 144 mmHg  HI    Diastolic Blood Pressure 98 mmHg  HI    Temperature Oral 36.7 degC    Heart Rate Monitored 93 bpm    Respiratory Rate 18 br/min    SpO2 98 %   .   Measurements   05/01/2021 10:49 EDT Body Mass Index est meas 52.69 kg/m2    Body Mass Index Measured 52.69 kg/m2   05/01/2021 10:46 EDT Height/Length Measured 163 cm    Weight Dosing 140 kg   .   General:  Alert, no acute distress, Very well-appearing young female sitting up in bed in no acute distress.    Skin:  Warm, dry, no pallor, no rash.    Head:  Normocephalic, atraumatic.    Neck:  Supple.   Eye:  Pupils are equal, round and reactive to light, extraocular movements are intact.    Ears, nose, mouth and throat:  Oral mucosa moist.   Cardiovascular:  Regular rate and rhythm, No murmur, No edema.    Respiratory:  Lungs are clear to auscultation, respirations are non-labored, breath sounds are equal, Symmetrical  chest wall expansion.    Chest wall:  Mildly tender over the anterior lower chest wall bilaterally.  No crepitus, deformity, erythema, edema, or ecchymosis..   Back:  Nontender, Normal range of motion, Normal alignment, no step-offs.    Gastrointestinal:  Soft, Nontender, Non distended, Normal bowel sounds, No organomegaly, No masses, rebound, or guarding..    Neurological:  Alert and oriented to person, place, time, and situation, normal sensory observed.    Psychiatric:  Cooperative, appropriate mood & affect.       Medical Decision Making   Rationale:  PA/NP reviewed with co-signing physician: diagnosis and plan of care, 35 year old female with no significant past medical history presents to the ED with concern for chest wall pain for 3 days after coughing grinder hit her in her chest.  On exam she is extremely well-appearing, hemodynamically stable, afebrile.  Very mild anterior chest wall pain but no crepitus or deformity, no bruising.  Good lung sounds.  Chest x-ray unremarkable; no evidence of fractures or underlying complications thereof.  ECG shows no evidence of acute ischemia or dangerous arrhythmia.  No indication for further emergent lab work or imaging at this time.  Treated in the ED with a dose of Toradol and discharged home with Rx for ibuprofen.  We will have her follow-up with her PCP this week.  Discussed tricked return precautions; knows to return to the ED immediately if her symptoms worsen or any new symptoms develop..    Documents reviewed:  Emergency department nurses' notes.   Electrocardiogram:  Emergency Provider interpretation performed by me, Normal sinus rhythm, rate 81 bpm.  No T wave inversions.  No ST elevation or depression.  No evidence of acute ischemia.  Normal PR.  Normal QT.  No prior tracings for comparison..    Radiology results:  Rad Results (ST)   XR Ribs w/ PA Chest Bilateral  ?  05/01/21 11:44:20  XR Ribs w/ PA Chest Bilateral 05/01/21    History: Chest  pain    Comparison: None    Findings: There is no active disease of the heart, lungs, or mediastinum. No  pneumothorax. Portions of the left  lateral ribs are excluded.    Impression: No acute lung disease.  ?  Signed By: Malachy Chamber DUBOIS-MD  .      Impression and Plan   Diagnosis   Chest wall contusion (ICD10-CM S20.219A, Discharge, Medical)   Plan   Condition: Improved, Stable.    Disposition: Discharged: Time  05/01/2021 11:53:00, to home.    Prescriptions: Launch prescriptions   Pharmacy:  ibuprofen 800 mg oral tablet (Prescribe): 800 mg, 1 tabs, Oral, TID, for 3 days, PRN: moderate pain (4-7), 12 tabs, 0 Refill(s), Launch Meds List (Selected)   Inpatient Medications  Completed  Toradol: 15 mg, 1 mL, IM, Once.    Patient was given the following educational materials: Blunt Chest Trauma, Chest Wall Pain.    Follow up with: PCP Within 1 week Please follow-up with your primary care provider.  If you do not have a PCP, you can call [phone]. Return to the ED immediately if symptoms worsen or new symptoms develop.   .    Counseled: I had a detailed discussion with the patient and/or guardian regarding the historical points/exam findings supporting the discharge diagnosis and need for outpatient followup. Discussed the need to return to the ER if symptoms persist/worsen, or for any questions/concerns that arise at home.    Signature Line     Electronically Signed on 05/01/2021 12:40 PM EDT   ________________________________________________   Kevan Ny E-PAC      Electronically Signed on 05/03/2021 08:00 AM EDT   ________________________________________________   BURNS,  Timoteo Gaul            Modified by: Kevan Ny E-PAC on 05/01/2021 11:14 AM EDT      Modified by: Kevan Ny E-PAC on 05/01/2021 11:55 AM EDT      Modified by: Kevan Ny E-PAC on 05/01/2021 12:07 PM EDT      Modified by: Kevan Ny E-PAC on 05/01/2021 12:40 PM EDT

## 2021-05-01 NOTE — ED Notes (Signed)
 ED Triage Note       ED Triage Adult Entered On:  05/01/2021 10:49 EDT    Performed On:  05/01/2021 10:46 EDT by Tedd, RN, Tiffany A               Triage   Numeric Rating Pain Scale :   4   Chief Complaint :   wk comp claim hurt on job Sunday night, coffee grinder fell into chest then had to lift into place, chest has a annoying boom-boom-boom, and into shoulder, few headaches, took tyl, walked in no distress   [place] Mode of Arrival :   Private vehicle   Infectious Disease Documentation :   Document assessment   Temperature Oral :   36.7 degC(Converted to: 98.1 degF)    Heart Rate Monitored :   93 bpm   Respiratory Rate :   18 br/min   Systolic Blood Pressure :   144 mmHg (HI)    Diastolic Blood Pressure :   98 mmHg (HI)    SpO2 :   98 %   Oxygen Therapy :   Room air   Patient presentation :   None of the above   Chief Complaint or Presentation suggest infection :   No   Dosing Weight Obtained By :   Patient stated   Weight Dosing :   140 kg(Converted to: 308 lb 10 oz)    Height :   163 cm(Converted to: 5 ft 4 in)    Body Mass Index Dosing :   53 kg/m2   Timlin, RN, Tiffany A - 05/01/2021 10:46 EDT   DCP GENERIC CODE   Tracking Acuity :   4   Tracking Group :   ED [employer]   [place], RN, Annabella A - 05/01/2021 10:46 EDT   ED General Section :   Document assessment   Pregnancy Status :   Patient denies   ED Allergies Section :   Document assessment   ED Reason for Visit Section :   Document assessment   Timlin, RN, Tiffany A - 05/01/2021 10:46 EDT   ID Risk Screen Symptoms   Last 90 days COVID-19 ID :   No   Timlin, RN, Tiffany A - 05/01/2021 10:46 EDT   Allergies   (As Of: 05/01/2021 10:49:37 EDT)   Allergies (Active)   penicillin  Estimated Onset Date:   Unspecified ; Reactions:   Rash ; Created By:   Mannie Mirza C-RN; Reaction Status:   Active ; Category:   Drug ; Substance:   penicillin ; Type:   Allergy ; Severity:   Mild ; Updated By:   Mannie Mirza BARNS; Reviewed Date:   05/01/2021 10:48 EDT         Psycho-Social   Last 3 mo, thoughts killing self/others :   Patient denies   Right click within box for Suspected Abuse policy link. :   None   Feels Safe Where Live :   Yes   Timlin, RN, Tiffany A - 05/01/2021 10:46 EDT   ED Reason for Visit   (As Of: 05/01/2021 10:49:37 EDT)   Diagnoses(Active)    Trunk / Chest injury  Date:   05/01/2021 ; Diagnosis Type:   Reason For Visit ; Confirmation:   Complaint of ; Clinical Dx:   Trunk / Chest injury ; Classification:   Medical ; Clinical Service:   Emergency medicine ; Code:   PNED ; Probability:   0 ;  Diagnosis Code:   [id number]

## 2021-05-01 NOTE — ED Notes (Signed)
ED Patient Education Note     Patient Education Materials Follows:  Orthopedics     Chest Wall Pain    Chest wall pain is pain in or around the bones and muscles of your chest. Sometimes, an injury causes this pain. Excessive coughing or overuse of arm and chest muscles may also cause chest wall pain. Sometimes, the cause may not be known. This pain may take several weeks or longer to get better.      Follow these instructions at home:    Managing pain, stiffness, and swelling       If directed, put ice on the painful area:  ? Put ice in a plastic bag.    ? Place a towel between your skin and the bag.    ? Leave the ice on for 20 minutes, 2?3 times per day.        Activity     Rest as told by your health care provider.     Avoid activities that cause pain. These include any activities that use your chest muscles or your abdominal and side muscles to lift heavy items. Ask your health care provider what activities are safe for you.      General instructions       Take over-the-counter and prescription medicines only as told by your health care provider.     Do not use any products that contain nicotine or tobacco, such as cigarettes, e-cigarettes, and chewing tobacco. These can delay healing after injury. If you need help quitting, ask your health care provider.     Keep all follow-up visits as told by your health care provider. This is important.        Contact a health care provider if:     You have a fever.     Your chest pain becomes worse.     You have new symptoms.      Get help right away if:     You have nausea or vomiting.     You feel sweaty or light-headed.     You have a cough with mucus from your lungs (sputum) or you cough up blood.     You develop shortness of breath.    These symptoms may represent a serious problem that is an emergency. Do not wait to see if the symptoms will go away. Get medical help right away. Call your local emergency services (911 in the [place] Do not drive yourself to the  hospital.      Summary     Chest wall pain is pain in or around the bones and muscles of your chest.     Depending on the cause, it may be treated with ice, rest, medicines, and avoiding activities that cause pain.     Contact a health care provider if you have a fever, worsening chest pain, or new symptoms.     Get help right away if you feel light-headed or you develop shortness of breath. These symptoms may be an emergency.      This information is not intended to replace advice given to you by your health care provider. Make sure you discuss any questions you have with your health care provider.      Document Revised: 06/17/2018 Document Reviewed: 06/17/2018  Elsevier Patient Education ? 2021 Elsevier Inc.         Blunt Chest Trauma    Blunt chest trauma is an injury that is caused by a hard, direct hit to  the chest. The hit can be strong enough to injure multiple body parts and organs. Blunt trauma does not involve a puncture of the skin.    Blunt chest trauma often results in bruised or broken (fractured) ribs. In many cases, the soft tissue in the chest wall is also injured, and that damage causes pain and bruising. Internal organs, such as the heart and lungs, can be injured as well.    Blunt chest trauma can lead to serious medical problems. This injury requires immediate medical care.      What are the causes?    This condition may be caused by:   Motor vehicle collisions.     Falls.     Physical violence.     Sports injuries.        What are the signs or symptoms?    Symptoms of this condition include:   Chest pain. The pain may be worse when you breathe deeply or move.     Shortness of breath.     Light-headedness.     Bruising.     Tenderness.     Swelling.     Feeling as if your heart is racing.        How is this diagnosed?    This condition is diagnosed based on your medical history and a physical exam. You may also have imaging tests, including:   X-ray.     CT scan.     MRI.     Ultrasound.         How is this treated?    Treatment for this condition depends on your injury type and severity of the injury. You may need to stay in the hospital. Treatment may include:   Pain medicine. You may be given pain medicine through an IV at first. You may also be given over-the-counter and prescription pain medicines.     Tubes or other devices, such as an incentive spirometer, to help you breathe.     Inserting a tube into your chest to drain excess fluid, blood, or air.     Fluid or blood transfusions through an IV.     Surgery to repair broken ribs and fix damaged tissue and organs.     Lung (pulmonary) rehabilitation. This may include exercises, counseling, or support groups.        Follow these instructions at home:    Medicines     Take over-the-counter and prescription medicines only as told by your health care provider.     Ask your health care provider if the medicine prescribed to you:   ? Requires you to avoid driving or using machinery.    ? Can cause constipation. You may need to take these actions to prevent or treat constipation:  ? Drink enough fluid to keep your urine pale yellow.     ? Take over-the-counter or prescription medicines.    ? Eat foods that are high in fiber, such as beans, whole grains, and fresh fruits and vegetables.    ? Limit foods that are high in fat and processed sugars, such as fried or sweet foods.           Managing pain and swelling      If directed, put ice on the injured area. To do this:   Put ice in a plastic bag.     Place a towel between your skin and the bag.     Leave the ice on for 20 minutes, 2?3  times a day.     Remove the ice if your skin turns bright red. This is very important. If you cannot feel pain, heat, or cold, you have a greater risk of damage to the area.        Activity       Do not lift anything that is heavier than 10 lb (4.5 kg), or the limit that you are told, until your health care provider says that it is safe.     Return to your normal activities as  told by your health care provider. Ask your health care provider what activities are safe for you.     Do exercises as told by your health care provider.      General instructions     Do not use any products that contain nicotine or tobacco, such as cigarettes, e-cigarettes, and chewing tobacco. These can delay healing. If you need help quitting, ask your health care provider.     Use your incentive spirometer as told to help you take deep breaths.     Consider joining a support group. This may help you learn about your condition and techniques to help with recovery.     Keep all follow-up visits. This is important. Follow-up visits may include counseling.        Contact a health care provider if:     Your pain gets worse.     You develop a cough or wheezing.      Get help right away if:     You experience any of the following:  ? Shortness of breath.    ? Pain in your abdomen.    ? Nausea or vomiting.    ? A fever or chills.    ? A rapid heart rate.       You cough up blood.     You feel dizzy or weak.     You faint.     You have severe chest pain. This may come with other symptoms, such as:  ? Dizziness.    ? Shortness of breath.    ? Pain in your neck, jaw, or back, or in one arm or both arms.      These symptoms may represent a serious problem that is an emergency. Do not wait to see if the symptoms will go away. Get medical help right away. Call your local emergency services (911 in the [place] Do not drive yourself to the hospital.      Summary     Blunt chest trauma is an injury that is caused by a hard, direct hit to the chest. It may involve broken ribs, damage to the chest wall, and injury to internal organs such as the heart and lungs.     Blunt chest trauma can lead to serious medical problems. This injury requires immediate medical care.     Symptoms may include chest pain when moving or taking deep breaths, shortness of breath, bruising or swelling of the chest, faster heartbeat, and light-headedness.      Treatment for this condition depends on what type of injury you have and how severe it is.     Make sure you know which symptoms should cause you to get help right away.      This information is not intended to replace advice given to you by your health care provider. Make sure you discuss any questions you have with your health care provider.      Document Revised:  03/21/2020 Document Reviewed: 03/21/2020  Elsevier Patient Education ? 2021 Elsevier Inc.

## 2021-07-03 LAB — COVID-19 & INFLUENZA COMBO
INFLUENZA A: NOT DETECTED
INFLUENZA B: NOT DETECTED
SARS-CoV-2: NOT DETECTED

## 2021-07-03 NOTE — ED Notes (Signed)
 ED Patient Education Note     ;Patient Education Materials Follows:               Free Dental Clinics       [facility]  (E.C.C.[facility])  [address] [place], [address]  [phone]  Monday & Tuesday 10:00am - 4:00pm  Thursday 4:00pm - 7:00pm             Clinics by appointment only     [facility]  (also Spanish speaking service)   [address] [place], [address]  [phone]  [place] employed or residents only (uninsured)  Mon-Thurs, 8:30am-4:00pm.   Fri, 9am-11am (no dentist on site, Q&A session only),  Tue, 3:30pm-8:30pm (emergencies only, first 10 patients only)      [facility]  [address] [place] [address]  [phone]  Thurs, 5pm-9pm, no appointments, seen on order of arrival, extractions only     [facility]  [address], [place] [address] Emergency Dental Clinic room [phone]; option 1  Mon-Fri, 8:45am-1245pm, except Tue. Hours of operation depend on school semester. After hours emergencies call [phone] ask for dental resident on call.      [facility]  (also Spanish speaking service)  [employer] [facility]  [address], [place] [address]  [phone]  For children 3-41yrs only without insurance or Medicaid  Contact clinic for hours (hours of operation depend on school  semester), weekends only by appointment     [facility]  [address], [place]    [phone]   Mon 5pm-9pm.  Uninsured and low income residents of [place],  [place],  and [place] counties.     [employer], [address] [address] [phone]. Fee based care.              Dentistry     Dental Abscess      A dental abscess is a collection of pus in or around a tooth that results from an infection. An abscess can cause  pain in the affected area as well as other symptoms. Treatment is important to help with symptoms and to prevent the infection from spreading.      What are the causes?    This condition is caused by a bacterial infection around the root of the tooth that involves the inner part of the tooth (pulp). It may result from:   Severe tooth decay.     Trauma to the tooth, such as a broken or chipped tooth, that allows bacteria to enter into the pulp.     Severe gum disease around a tooth.        What increases the risk?    This condition is more likely to develop in males. It is also more likely to develop in people who:   Have dental decay (cavities).     Eat sugary snacks between meals.     Use tobacco products.     Have diabetes.     Have a weakened disease-fighting system (immune system).     Do not brush and care for their teeth regularly.        What are the signs or symptoms?    Symptoms of this condition include:  Severe pain in and around the infected tooth.     Swelling and redness around the infected tooth, in the mouth, or in the face.     Tenderness.     Pus drainage.     Bad breath.     Bitter taste in the mouth.     Difficulty swallowing.     Difficulty opening the mouth.     Nausea.     Vomiting.     Chills.     Swollen neck glands.     Fever.        How is this diagnosed?    This condition is diagnosed based on:   Your symptoms and your medical and dental history.     An examination of the infected tooth. During the exam, your dentist may tap on the infected tooth.      You may also have X-rays of the affected area.      How is this treated?    This condition is treated by getting rid of the infection. This may be done with:   Incision and drainage. This procedure is done by making an incision in the abscess to drain out the pus. Removing pus is the first priority in treating an abscess.     Antibiotic medicines. These may be used in certain situations.     Antibacterial mouth rinse.     A root canal.  This may be performed to save the tooth. Your dentist accesses the visible part of your tooth (crown) with a drill and removes any damaged pulp. Then the space is filled and sealed off.     Tooth extraction. The tooth is pulled out if it cannot be saved by other treatment.      You may also receive treatment for pain, such as:   Acetaminophen or NSAIDs.     Gels that contain a numbing medicine.     An injection to block the pain near your nerve.        Follow these instructions at home:    Medicines     Take over-the-counter and prescription medicines only as told by your dentist.     If you were prescribed an antibiotic, take it as told by your dentist. Do not stop taking the antibiotic even if you start to feel better.     If you were prescribed a gel that contains a numbing medicine, use it exactly as told in the directions. Do not use these gels for children who are younger than 68 years of age.     Do not drive or use heavy machinery while taking prescription pain medicine.      General instructions     Rinse out your mouth often with salt water to relieve pain or swelling. To make a salt-water mixture, completely dissolve ??1 tsp of salt in 1 cup of warm water.     Eat a soft diet while your abscess is healing.     Drink enough fluid to keep your urine pale yellow.     Do not apply heat to the outside of your mouth.     Do not use any products that contain nicotine or tobacco, such as cigarettes and e-cigarettes. If you need help quitting, ask your health care provider.     Keep all follow-up visits as told by your dentist. This is important.        How is this prevented?     Brush your teeth every morning  and night with fluoride toothpaste. Floss one time each day.     Get regularly scheduled dental cleanings.     Consider having a dental sealant applied on teeth that have deep holes (caries).     Drink fluoridated water regularly. This includes most tap water. Check the label on bottled water to see if it  contains fluoride.     Drink water instead of sugary drinks.     Eat healthy meals and snacks.     Wear a mouth guard or face shield to protect your teeth while playing sports.      Contact a health care provider if:     Your pain is worse and is not helped by medicine.      Get help right away if:     You have a fever or chills.     Your symptoms suddenly get worse.     You have a very bad headache.     You have problems breathing or swallowing.     You have trouble opening your mouth.     You have swelling in your neck or around your eye.      Summary     A dental abscess is a collection of pus in or around a tooth that results from an infection.     A dental abscess may result from severe tooth decay, trauma to the tooth, or severe gum disease around a tooth.     Symptoms include severe pain, swelling, redness, and drainage of pus in and around the infected tooth.     The first priority in treating a dental abscess is to drain out the pus. Treatment may also involve removing damage inside the tooth (root canal) or pulling out (extracting) the tooth.      This information is not intended to replace advice given to you by your health care provider. Make sure you discuss any questions you have with your health care provider.      Document Revised: 11/27/2017 Document Reviewed: 08/17/2017  Elsevier Patient Education ? 2021 Elsevier Inc.      Infectious Disease     Upper Respiratory Infection, Adult    An upper respiratory infection (URI) is a common viral infection of the nose, throat, and upper air passages that lead to the lungs. The most common type of URI is the common cold. URIs usually get better on their own, without medical treatment.      What are the causes?    A URI is caused by a virus. You may catch a virus by:   Breathing in droplets from an infected person's cough or sneeze.     Touching something that has been exposed to the virus (contaminated) and then touching your mouth, nose, or eyes.        What  increases the risk?    You are more likely to get a URI if:   You are very young or very old.     It is autumn or winter.     You have close contact with others, such as at a daycare, school, or health care facility.     You smoke.     You have long-term (chronic) heart or lung disease.     You have a weakened disease-fighting (immune) system.     You have nasal allergies or asthma.     You are experiencing a lot of stress.     You work in an area that has  poor air circulation.     You have poor nutrition.        What are the signs or symptoms?    A URI usually involves some of the following symptoms:   Runny or stuffy (congested) nose.     Sneezing.     Cough.     Sore throat.     Headache.     Fatigue.     Fever.     Loss of appetite.     Pain in your forehead, behind your eyes, and over your cheekbones (sinus pain).     Muscle aches.     Redness or irritation of the eyes.     Pressure in the ears or face.        How is this diagnosed?    This condition may be diagnosed based on your medical history and symptoms, and a physical exam. Your health care provider may use a cotton swab to take a mucus sample from your nose (nasal swab). This sample can be tested to determine what virus is causing the illness.      How is this treated?    URIs usually get better on their own within 7?10 days. You can take steps at home to relieve your symptoms. Medicines cannot cure URIs, but your health care provider may recommend certain medicines to help relieve symptoms, such as:   Over-the-counter cold medicines.     Cough suppressants. Coughing is a type of defense against infection that helps to clear the respiratory system, so take these medicines only as recommended by your health care provider.     Fever-reducing medicines.        Follow these instructions at home:    Activity     Rest as needed.     If you have a fever, stay home from work or school until your fever is gone or until your health care provider says you are no  longer contagious. Your health care provider may have you wear a face mask to prevent your infection from spreading.      Relieving symptoms     Gargle with a salt-water mixture 3?4 times a day or as needed. To make a salt-water mixture, completely dissolve ??1 tsp of salt in 1 cup of warm water.     Use a cool-mist humidifier to add moisture to the air. This can help you breathe more easily.      Eating and drinking       Drink enough fluid to keep your urine pale yellow.     Eat soups and other clear broths.      General instructions       Take over-the-counter and prescription medicines only as told by your health care provider. These include cold medicines, fever reducers, and cough suppressants.     Do not use any products that contain nicotine or tobacco, such as cigarettes and e-cigarettes. If you need help quitting, ask your health care provider.      Stay away from secondhand smoke.     Stay up to date on all immunizations, including the yearly (annual) flu vaccine.     Keep all follow-up visits as told by your health care provider. This is important.      How to prevent the spread of infection to others       URIs can be passed from person to person (are contagious). To prevent the infection from spreading:  ? Wash your hands often with  soap and water. If soap and water are not available, use hand sanitizer.    ? Avoid touching your mouth, face, eyes, or nose.    ? Cough or sneeze into a tissue or your sleeve or elbow instead of into your hand or into the air.          Contact a health care provider if:     You are getting worse instead of better.     You have a fever or chills.     Your mucus is brown or red.     You have yellow or brown discharge coming from your nose.     You have pain in your face, especially when you bend forward.     You have swollen neck glands.     You have pain while swallowing.     You have white areas in the back of your throat.      Get help right away if:     You have  shortness of breath that gets worse.     You have severe or persistent:  ? Headache.    ? Ear pain.    ? Sinus pain.    ? Chest pain.       You have chronic lung disease along with any of the following:  ? Wheezing.    ? Prolonged cough.    ? Coughing up blood.    ? A change in your usual mucus.       You have a stiff neck.     You have changes in your:  ? Vision.    ? Hearing.    ? Thinking.    ? Mood.        Summary     An upper respiratory infection (URI) is a common infection of the nose, throat, and upper air passages that lead to the lungs.     A URI is caused by a virus.     URIs usually get better on their own within 7?10 days.     Medicines cannot cure URIs, but your health care provider may recommend certain medicines to help relieve symptoms.      This information is not intended to replace advice given to you by your health care provider. Make sure you discuss any questions you have with your health care provider.      Document Revised: 08/23/2020 Document Reviewed: 08/23/2020  Elsevier Patient Education ? 2021 Elsevier Inc.

## 2021-07-03 NOTE — Discharge Summary (Signed)
ED Clinical Summary                         [facility]  [address]  [place], [place], [address]  [id number]           PERSON INFORMATION  Name: [name] [name], [name] [name] Age:  35 Years DOB: 02-12-1986   Sex: Female Language: English PCP: PCP,  UNKNOWN   Marital Status:  Married Phone: [phone] Med Service: MED-Medicine   MRN:  [id number] Acct# [id number] Arrival: 07/03/2021 10:08:00   Visit Reason: Cough; Abscess - simple; RASH ON FACE Acuity: 3 LOS: 000 01:05   Address:      [address] [place] [address]  Diagnosis:      Acute URI; Dental abscess  Printed Prescriptions:            Allergies      penicillin (Rash)      Medications Administered During Visit:                  Medication Dose Route   clindamycin 450 mg Oral       Patient Medication List:              albuterol (albuterol CFC free 90 mcg/inh inhalation aerosol) 2 Puffs Inhale (breathe in) every 4 hours as needed shortness of breath/wheezing. may administer 1 or 2 puffs every 4 -6 hours as needed. Refills: 0.  benzonatate (Tessalon Perles 100 mg oral capsule) 1 Tabs Oral (given by mouth) 3 times a day as needed cough for 7 Days. Refills: 0.  clindamycin (clindamycin 150 mg oral capsule) 3 Capsules Oral (given by mouth) every 8 hours for 7 Days. Refills: 0.  dextroamphetamine-amphetamine (dextroamphetamine-amphetamine 15 mg oral capsule, extended release) TAKE 1 CAPSULE BY MOUTH EVERY MORNING.         Major Tests and Procedures:  The following procedures and tests were performed during your ED visit.  COMMONPROCEDURES%>  COMMON PROCEDURESCOMMENTS%>          Laboratory Orders  Name Status Details   [facility] Ordered Nasopharyngeal Swab, Stat, ST - Stat, 07/03/21 10:49:00 EDT, 07/03/21 10:50:00 EDT, Nurse collect, Blossom Hoops L-MD, Print label Y/N               Radiology Orders  No radiology orders were placed.              Patient Care Orders  Name Status Details   COVID-19 Status  Ordered 07/03/21 10:50:18 EDT, NOT VALID FOR pharmacy, laboratory, radiology., 07/03/21 10:50:18 EDT, COVID-19 PUI - under investigation   Discharge Patient Ordered 07/03/21 10:51:00 EDT   ED Assessment Adult Completed 07/03/21 10:22:36 EDT, 07/03/21 10:22:36 EDT   ED Secondary Triage Completed 07/03/21 10:22:36 EDT, 07/03/21 10:22:36 EDT   ED Triage Adult Completed 07/03/21 10:08:50 EDT, 07/03/21 10:08:50 EDT   Notify Provider Completed 07/03/21 10:50:18 EDT, This message can only be seen by Nursing, it is not visible to Pharmacy, Laboratory, or Radiology., 07/03/21 10:50:18 EDT   Patient Isolation Ordered 07/03/21 10:49:00 EDT, Contact and Airborne, Constant Indicator             PROVIDER INFORMATION               Provider Role Assigned Gala Lewandowsky L-MD ED Provider 07/03/2021 10:23:45    Kevan Ny E-PAC ED MidLevel 07/03/2021 10:23:49 07/03/2021 10:36:06   Leola Brazil N-RN ED  Nurse 07/03/2021 10:40:43        Attending Physician:  Blossom Hoops L-MD     Admit Doc  Blossom Hoops L-MD     Consulting Doc       VITALS INFORMATION  Vital Sign Triage Latest   Temp Oral ORAL_1%>36.9 degC ORAL%>36.9 degC   Temp Temporal TEMPORAL_1%> TEMPORAL%>   Temp Intravascular INTRAVASCULAR_1%> INTRAVASCULAR%>   Temp Axillary AXILLARY_1%> AXILLARY%>   Temp Rectal RECTAL_1%> RECTAL%>   02 Sat 97 % 100 %   Respiratory Rate RATE_1%>20 br/min RATE%>16 br/min   Peripheral Pulse Rate PULSE RATE_1%> PULSE RATE%>   Apical Heart Rate HEART RATE_1%> HEART RATE%>   Blood Pressure BLOOD PRESSURE_1%>/ BLOOD PRESSURE_1%>110 mmHg BLOOD PRESSURE%>131 mmHg / BLOOD PRESSURE%>87 mmHg                 Immunizations      No Immunizations Documented This Visit          DISCHARGE INFORMATION   Discharge Disposition: H Outpt-Sent Home   Discharge Location:    Home   Discharge Date and Time:    07/03/2021 11:13:58   ED Checkout Date and Time:    07/03/2021 11:13:58     DEPART REASON INCOMPLETE INFORMATION               Depart Action  Incomplete Reason   Interactive View/I&O Recently assessed               Problems      No Problems Documented              Smoking Status      No Smoking Status Documented         PATIENT EDUCATION INFORMATION  Instructions:       Free Dental Clinics (CUSTOM); Upper Respiratory Infection, Adult; Dental Abscess     Follow up:                    With: Address: When:   Follow up with primary care provider  Within 1 to 2 days   Comments:   Please return to the emergency room with any acute questions, concerns, worsening status or any delay to your outpatient care.     Thank you so much for visiting with [place] today. You have had a screening emergency medical exam and to this point, no emergent condition has been identified. It is important to realize that of course your visit today is simply a moment in time and that your medical condition can certainly change and become worse, necessitating re-evaluation in an urgent or emergent manner. It is your responsibility to seek care in such instances. It is also your responsibility to follow up with your primary care provider to discuss your Emergency Department visit and to go over any and all testing that was incurred during your Emergency Department visit. We have made you aware of any pertinent results at this visit, however this may not have been comprehensive. Lack of an acute emergency condition does not mean that there is not a problem, nor does it replace a thorough exam performed by a primary care physician. It is your responsibility to follow your discharge instructions as given, to follow up with any other physicians as indicated, and to return to the Emergency Department as instructed. Thank you again for visiting with [place] today, please let us know if you have any further questions.   You may also call [phone] for referral to physicians in your  area.              ED PROVIDER DOCUMENTATION

## 2021-07-03 NOTE — ED Notes (Signed)
ED Triage Note       ED Secondary Triage Entered On:  07/03/2021 10:45 EDT    Performed On:  07/03/2021 10:44 EDT by Leola Brazil N-RN               General Information   Barriers to Learning :   None evident   ED Home Meds Section :   Document assessment   [facility] ED Fall Risk Section :   Document assessment   ED Advance Directives Section :   Document assessment   ED Palliative Screen :   N/A (prefilled for <35yo)   Leola Brazil N-RN - 07/03/2021 10:44 EDT   (As Of: 07/03/2021 10:45:19 EDT)   Diagnoses(Active)    Abscess - simple  Date:   07/03/2021 ; Diagnosis Type:   Reason For Visit ; Confirmation:   Complaint of ; Clinical Dx:   Abscess - simple ; Classification:   Medical ; Clinical Service:   Emergency medicine ; Code:   PNED ; Probability:   0 ; Diagnosis Code:   [id number]      Cough  Date:   07/03/2021 ; Diagnosis Type:   Reason For Visit ; Confirmation:   Complaint of ; Clinical Dx:   Cough ; Classification:   Medical ; Clinical Service:   Emergency medicine ; Code:   PNED ; Probability:   0 ; Diagnosis Code:   [id number]             -    Procedure History   (As Of: 07/03/2021 10:45:19 EDT)     Anesthesia Minutes:   0 ; Procedure Name:   Arm, right ; Procedure Minutes:   0            Anesthesia Minutes:   0 ; Procedure Name:   C-section delivery ; Procedure Minutes:   0            Anesthesia Minutes:   0 ; Procedure Name:   Tubal ligation ; Procedure Minutes:   0            [facility] Fall Risk Assessment Tool   Hx of falling last 3 months ED Fall :   No   Patient confused or disoriented ED Fall :   No   Patient intoxicated or sedated ED Fall :   No   Patient impaired gait ED Fall :   No   Use a mobility assistance device ED Fall :   No   Patient altered elimination ED Fall :   No   [facility] ED Fall Score :   0    Leola Brazil N-RN - 07/03/2021 10:44 EDT   ED Advance Directive   Advance Directive :   No   Leola Brazil N-RN - 07/03/2021 10:44 EDT   Med Hx   Medication  List   (As Of: 07/03/2021 10:45:19 EDT)   Home Meds    dextroamphetamine-amphetamine  :   dextroamphetamine-amphetamine ; Status:   Documented ; Ordered As Mnemonic:   dextroamphetamine-amphetamine 15 mg oral capsule, extended release ; Simple Display Line:   TAKE 1 CAPSULE BY MOUTH EVERY MORNING ; Catalog Code:   dextroamphetamine-amphetamine ; Order Dt/Tm:   07/03/2021 10:45:12 EDT          naproxen  :   naproxen ; Status:   Completed ; Ordered As Mnemonic:   naproxen sodium 550 mg oral tablet ; Simple Display Line:   550 mg, 1 tabs, Oral, BID,  PRN: for pain, 20 tabs, 0 Refill(s) ; Catalog Code:   naproxen ; Order Dt/Tm:   05/01/2021 11:00:39 EDT          clindamycin  :   clindamycin ; Status:   Discontinued ; Ordered As Mnemonic:   clindamycin ; Simple Display Line:   0 Refill(s) ; Catalog Code:   clindamycin ; Order Dt/Tm:   04/08/2021 18:40:04 EDT

## 2021-07-03 NOTE — ED Notes (Signed)
ED Triage Note       ED Triage Adult Entered On:  07/03/2021 10:21 EDT    Performed On:  07/03/2021 10:18 EDT by DEMEO, RN, ANTHONY M               Triage   Systolic Blood Pressure :   160 mmHg (HI)    Diastolic Blood Pressure :   110 mmHg (>HHI)    DEMEO, RN, Garlan Fillers - 07/03/2021 10:21 EDT   Numeric Rating Pain Scale :   10 = Worst possible pain   Chief Complaint :   pt c/o abscess to left side of face. also complaining of cough, runny nose, fever. ambulatory, speaking in full sentences, airway patent, in NAD.   [place] Mode of Arrival :   Private vehicle   Infectious Disease Documentation :   Document assessment   Temperature Oral :   36.9 degC(Converted to: 98.4 degF)    Heart Rate Monitored :   105 bpm (HI)    Respiratory Rate :   20 br/min   SpO2 :   97 %   Oxygen Therapy :   Room air   Patient presentation :   HR > 100   Chief Complaint or Presentation suggest infection :   No   Weight Dosing :   144.5 kg(Converted to: 318 lb 9 oz)    Height :   162 cm(Converted to: 5 ft 4 in)    Body Mass Index Dosing :   55 kg/m2   DEMEO, RN, ANTHONY M - 07/03/2021 10:18 EDT   DCP GENERIC CODE   Tracking Group :   ED [employer]   Tracking Acuity :   3   DEMEO, RN, Garlan Fillers - 07/03/2021 10:18 EDT   ED General Section :   Document assessment   Pregnancy Status :   Patient denies   ED Allergies Section :   Document assessment   ED Reason for Visit Section :   Document assessment   DEMEO, RN, Garlan Fillers - 07/03/2021 10:18 EDT   ID Risk Screen Symptoms   Recent Travel History :   No recent travel   TB Symptom Screen :   No symptoms   Last 90 days COVID-19 ID :   No   Close Contact with COVID-19 ID :   No   Last 14 days COVID-19 ID :   No   DEMEO, RN, ANTHONY M - 07/03/2021 10:18 EDT   Allergies   (As Of: 07/03/2021 10:21:23 EDT)   Allergies (Active)   penicillin  Estimated Onset Date:   Unspecified ; Reactions:   Rash ; Created By:   Thane Edu C-RN; Reaction Status:   Active ; Category:   Drug ; Substance:   penicillin ;  Type:   Allergy ; Severity:   Mild ; Updated By:   Thane Edu C-RN; Reviewed Date:   07/03/2021 10:20 EDT        Psycho-Social   Last 3 mo, thoughts killing self/others :   Patient denies   Right click within box for Suspected Abuse policy link. :   None   Feels Safe Where Live :   Yes   ED Behavioral Activity Rating Scale :   4 - Quiet and awake (normal level of activity)   DEMEO, RN, Garlan Fillers - 07/03/2021 10:18 EDT   ED Reason for Visit   (As Of: 07/03/2021 10:21:23 EDT)   Diagnoses(Active)  Abscess - simple  Date:   07/03/2021 ; Diagnosis Type:   Reason For Visit ; Confirmation:   Complaint of ; Clinical Dx:   Abscess - simple ; Classification:   Medical ; Clinical Service:   Emergency medicine ; Code:   PNED ; Probability:   0 ; Diagnosis Code:   [id number]      Cough  Date:   07/03/2021 ; Diagnosis Type:   Reason For Visit ; Confirmation:   Complaint of ; Clinical Dx:   Cough ; Classification:   Medical ; Clinical Service:   Emergency medicine ; Code:   PNED ; Probability:   0 ; Diagnosis Code:   [id number]

## 2021-07-03 NOTE — ED Provider Notes (Signed)
Mouth Pain/Cough *ED        Patient:   [name] [name], [name] [name]            MRN: [id number]            FIN: [id number]               Age:   35 years     Sex:  Female     DOB:  1986/05/25   Associated Diagnoses:   Dental abscess; Acute URI   Author:   Blossom Hoops L-MD      Basic Information   Time seen: Provider Seen (ST)   ED Provider/Time:    Blossom Hoops L-MD / 07/03/2021 10:23  .   History source: Patient.   Arrival mode: Private vehicle.   History limitation: None.   Additional information: Chief Complaint from Nursing Triage Note   Chief Complaint  Chief Complaint: pt c/o abscess to left side of face. also complaining of cough, runny nose, fever. ambulatory, speaking in full sentences, airway patent, in NAD. (07/03/21 10:18:00).      History of Present Illness   35 year old female presenting with complaint of some erythema to her left face.  She also reports some dental pain.  She also endorses some general URI symptoms as well.  She does have a history of prior dental related abscesses.  She has a cracked left mandibular molar.  She has not been able to see a dentist regarding this yet.  Endorses no compromise to her airway.  No stridor.  She arrives with stable vital signs..        Review of Systems   Skin symptoms:  Rash.   Respiratory symptoms:  Cough, No shortness of breath,    Cardiovascular symptoms:  No chest pain, no palpitations, no tachycardia.              Additional review of systems information: All other systems reviewed and otherwise negative.      Health Status   Allergies:    Allergic Reactions (Selected)  Mild  Penicillin- Rash..   Medications:  (Selected)   Prescriptions  Prescribed  Tessalon Perles 100 mg oral capsule: 1 tabs, Oral, TID, for 7 days, PRN: cough, 21 caps, 0 Refill(s)  albuterol CFC free 90 mcg/inh inhalation aerosol: 2 puffs, Inhale, q4hr, may administer 1 or 2 puffs every 4 -6 hours as needed, PRN: shortness of breath/wheezing, 1 EA, 0 Refill(s)  clindamycin 150 mg oral  capsule: 450 mg, 3 caps, Oral, q8hr, for 7 days, 63 caps, 0 Refill(s)  Documented Medications  Documented  dextroamphetamine-amphetamine 15 mg oral capsule, extended release: TAKE 1 CAPSULE BY MOUTH EVERY MORNING.      Past Medical/ Family/ Social History   Surgical history: Reviewed as documented in chart.   Social history: Reviewed as documented in chart.   Problem list: Per nurse's notes.      Physical Examination               Vital Signs   Vital Signs   07/03/2021 10:18 EDT Systolic Blood Pressure 160 mmHg  HI    Diastolic Blood Pressure 110 mmHg  >HHI    Temperature Oral 36.9 degC    Heart Rate Monitored 105 bpm  HI    Respiratory Rate 20 br/min    SpO2 97 %   .   Measurements   07/03/2021 10:22 EDT Body Mass Index est meas 55.06 kg/m2   07/03/2021 10:22 EDT Body Mass Index Measured  55.06 kg/m2   07/03/2021 10:18 EDT Height/Length Measured 162 cm    Weight Dosing 144.5 kg   .   General:  Alert, no acute distress, well nourished.    Skin:  Warm, dry, intact, no rash.    Head:  Normocephalic, atraumatic.    Eye:  Pupils are equal, round and reactive to light, normal conjunctiva.    Ears, nose, mouth and throat:  Oral mucosa moist, no pharyngeal erythema or exudate, Patient has evidence of a cracked left mandibular molar, no obvious appreciable fluctuant localization that would benefit from drainage.    Cardiovascular:  Regular rate and rhythm, Normal peripheral perfusion, No edema.    Respiratory:  Lungs are clear to auscultation, respirations are non-labored, Symmetrical chest wall expansion.    Neurological:  No focal neurological deficit observed, normal speech observed.    Psychiatric:  Cooperative, appropriate mood & affect, normal judgment.       Medical Decision Making   Rationale:    35 year old female presenting with what appears to be a likely.  Or abscess associated with a fractured left molar  She also endorses upper respiratory symptoms  She is otherwise nontoxic-appearing and satting appropriately on room  air  Her lungs are clear to auscultation  She has had approximately 24 hours of symptoms  I will hold on immediate chest x-ray given her stability and short duration of symptoms  I will screen her for COVID and influenza  Disposition pending.   Documents reviewed:  Emergency department nurses' notes, emergency department records, prior records.       Reexamination/ Reevaluation   Time: 07/03/2021 11:11:00 .   Vital signs   results included from flowsheet : Vital Signs   07/03/2021 10:18 EDT       SpO2                      97 %     Course: progressing as expected.   Notes: Discharge with plan as discussed above..      Impression and Plan   Diagnosis   Dental abscess (ICD10-CM K04.7, Discharge, Medical)   Acute URI (ICD10-CM J06.9, Discharge, Medical)   Plan   Condition: Improved, Stable.    Disposition: Medically cleared, Discharged: to home.    Prescriptions: Launch prescriptions   Pharmacy:  Tessalon Perles 100 mg oral capsule (Prescribe): 1 tabs, Oral, TID, for 7 days, PRN: cough, 21 caps, 0 Refill(s)  albuterol CFC free 90 mcg/inh inhalation aerosol (Prescribe): 2 puffs, Inhale, q4hr, may administer 1 or 2 puffs every 4 -6 hours as needed, PRN: shortness of breath/wheezing, 1 EA, 0 Refill(s)  clindamycin 150 mg oral capsule (Prescribe): 450 mg, 3 caps, Oral, q8hr, for 7 days, 63 caps, 0 Refill(s).    Patient was given the following educational materials: Dental Abscess, Upper Respiratory Infection, Adult, Free Dental Clinics (CUSTOM).    Follow up with: Follow up with primary care provider Within 1 to 2 days Please return to the emergency room with any acute questions, concerns, worsening status or any delay to your outpatient care.    Thank you so much for visiting with [place] today.  You have had a screening emergency medical exam and to this point, no emergent condition has been identified.  It is important to realize that of course your visit today is simply a moment in time and that your medical condition can  certainly change and become worse, necessitating re-evaluation in an urgent or emergent manner.  It  is your responsibility to seek care in such instances.  It is also your responsibility to follow up with your primary care provider to discuss your Emergency Department visit and to go over any and all testing that was incurred during your Emergency Department visit.   We have made you aware of any pertinent results at this visit, however this may not have been comprehensive.  Lack of an acute emergency condition does not mean that there is not a problem, nor does it replace a thorough exam performed by a primary care physician.  It is your responsibility to follow your discharge instructions as given, to follow up with any other physicians as indicated, and to return to the Emergency Department as instructed.  Thank you again for visiting with [place] today, please let us know if you have any further questions.     You may also call [phone] for referral to physicians in your area.  .    Counseled: Patient, Regarding diagnosis, Regarding diagnostic results, Regarding treatment plan, Regarding prescription, Patient indicated understanding of instructions.    Signature Line     Electronically Signed on 07/03/2021 11:17 AM EDT   ________________________________________________   Blossom Hoops L-MD

## 2021-07-03 NOTE — ED Notes (Signed)
 ED Patient Summary       ;          [facility]  [address], [place], [place] [address]  [phone]  Discharge Instructions (Patient)  [name] [name], [name] [name]  DOB:  Apr 16, 1986                   MRN: [id number]                   FIN: [phone]  Reason For Visit: Cough; Abscess - simple; RASH ON FACE  Final Diagnosis: Acute URI; Dental abscess     Visit Date: 07/03/2021 10:08:00  Address: [address] [name] [address]  Phone: [phone]     Emergency Department Providers:         Primary Physician:      [name] [name]      [facility] would like to thank you for allowing us  to assist you with your healthcare needs. The following includes patient education materials and information regarding your injury/illness.     Follow-up Instructions:  You were seen today on an emergency basis. Please contact your primary care doctor for a follow up appointment. If you received a referral to a specialist doctor, it is important you follow-up as instructed.    It is important that you call your follow-up doctor to schedule and confirm the location of your next appointment. Your doctor may practice at multiple locations. The office location of your follow-up appointment may be different to the one written on your discharge instructions.    If you do not have a primary care doctor, please call [phone] for help in finding a [name]. [facility] Provider. For help in finding a specialist doctor, please call [phone].    If your condition gets worse before your follow-up with your primary care doctor or specialist, please return to the Emergency Department.      Coronavirus 2019 (COVID-19) Reminders:     Patients age 64 - 87, with parental consent, and over age 59 can make an appointment for a COVID-19 vaccine. Patients can contact their [name] doctors' offices to schedule an appointment to receive the COVID-19  vaccine. Patients who do not have a [name] [name] physician can call [phone] to schedule vaccination appointments.      Follow Up Appointments:  Primary Care Provider:     Name: PCP,  UNKNOWN     Phone:                  With: Address: When:   Follow up with primary care provider  Within 1 to 2 days   Comments:   Please return to the emergency room with any acute questions, concerns, worsening status or any delay to your outpatient care.     Thank you so much for visiting with us  today. You have had a screening emergency medical exam and to this point, no emergent condition has been identified. It is important to realize that of course your visit today is simply a moment in time and that your medical condition can certainly change and become worse, necessitating re-evaluation in an urgent or emergent manner. It is your responsibility to seek care in such instances. It is also your responsibility to follow up with your primary care provider to discuss your Emergency Department visit and to go over any and all testing that was incurred during your Emergency  Department visit. We have made you aware of any pertinent results at this visit, however this may not have been comprehensive. Lack of an acute emergency condition does not mean that there is not a problem, nor does it replace a thorough exam performed by a primary care physician. It is your responsibility to follow your discharge instructions as given, to follow up with any other physicians as indicated, and to return to the Emergency Department as instructed. Thank you again for visiting with us  today, please let us  know if you have any further questions.   You may also call [phone] for referral to physicians in your area.                 [facility] SERVICES%>             New Medications  Printed Prescriptions  albuterol  (albuterol  CFC free 90 mcg/inh inhalation aerosol) 2 Puffs Inhale (breathe in) every 4 hours as needed  shortness of breath/wheezing. may administer 1 or 2 puffs every 4 -6 hours as needed. Refills: 0.  Last Dose:____________________  benzonatate  (Tessalon  Perles 100 mg oral capsule) 1 Tabs Oral (given by mouth) 3 times a day as needed cough for 7 Days. Refills: 0.  Last Dose:____________________  Medications That Were Updated - Follow Current Instructions  Printed Prescriptions  Current clindamycin (clindamycin 150 mg oral capsule) 3 Capsules Oral (given by mouth) every 8 hours for 7 Days. Refills: 0.  Last Dose:____________________    Medications that have not changed  Other Medications  dextroamphetamine-amphetamine (dextroamphetamine-amphetamine 15 mg oral capsule, extended release) TAKE 1 CAPSULE BY MOUTH EVERY MORNING.  Last Dose:____________________      Allergy Info: penicillin     Discharge Additional Information          Discharge Patient 07/03/21 10:51:00 EDT      Patient Education Materials:                  Free Dental Clinics       [facility]  (E.C.C.O)  [address] [place], [address]  [phone]  Monday & Tuesday 10:00am - 4:00pm  Thursday 4:00pm - 7:00pm             Clinics by appointment only     [facility]  (also Spanish speaking service)   [address] [place], [address]  [phone]  [place] employed or residents only (uninsured)  Mon-Thurs, 8:30am-4:00pm.   Fri, 9am-11am (no dentist on site, Q&A session only),  Tue, 3:30pm-8:30pm (emergencies only, first 10 patients only)      [facility]  [address] [place] [address]  [phone]  Thurs, 5pm-9pm, no appointments, seen on order of arrival, extractions only     [facility] [place]  [facility]  [address] [place] [address] Emergency Dental Clinic room [phone]; option 1  Mon-Fri, 8:45am-1245pm, except Tue. Hours of operation depend on school semester. After hours emergencies call  [phone] ask for dental resident on call.      [facility]  (also Spanish speaking service)  [facility]  [address], [place] [address]  [phone]  For children 3-68yrs only without insurance or [place]  Contact clinic for hours (hours of operation depend on school  semester), weekends only by appointment  [facility]  [address], [place]    [phone]   Mon 5pm-9pm.  Uninsured and low income residents of [place],  and [place] counties.     [employer], [address] [address] [phone]. Fee based care.               Upper Respiratory Infection, Adult    An upper respiratory infection (URI) is a common viral infection of the nose, throat, and upper air passages that lead to the lungs. The most common type of URI is the common cold. URIs usually get better on their own, without medical treatment.      What are the causes?    A URI is caused by a virus. You may catch a virus by:   Breathing in droplets from an infected person's cough or sneeze.     Touching something that has been exposed to the virus (contaminated) and then touching your mouth, nose, or eyes.        What increases the risk?    You are more likely to get a URI if:   You are very young or very old.     It is autumn or winter.     You have close contact with others, such as at a daycare, school, or health care facility.     You smoke.     You have long-term (chronic) heart or lung disease.     You have a weakened disease-fighting (immune) system.     You have nasal allergies or asthma.     You are experiencing a lot of stress.     You work in an area that has poor air circulation.     You have poor nutrition.        What are the signs or symptoms?    A URI usually involves some of the following symptoms:   Runny or stuffy (congested) nose.     Sneezing.     Cough.     Sore  throat.     Headache.     Fatigue.     Fever.     Loss of appetite.     Pain in your forehead, behind your eyes, and over your cheekbones (sinus pain).     Muscle aches.     Redness or irritation of the eyes.     Pressure in the ears or face.        How is this diagnosed?    This condition may be diagnosed based on your medical history and symptoms, and a physical exam. Your health care provider may use a cotton swab to take a mucus sample from your nose (nasal swab). This sample can be tested to determine what virus is causing the illness.      How is this treated?    URIs usually get better on their own within 7?10 days. You can take steps at home to relieve your symptoms. Medicines cannot cure URIs, but your health care provider may recommend certain medicines to help relieve symptoms, such as:   Over-the-counter cold medicines.     Cough suppressants. Coughing is a type of defense against infection that helps to clear the respiratory system, so take these medicines only as recommended by your health care provider.     Fever-reducing medicines.        Follow these instructions at home:    Activity  Rest as needed.     If you have a fever, stay home from work or school until your fever is gone or until your health care provider says you are no longer contagious. Your health care provider may have you wear a face mask to prevent your infection from spreading.      Relieving symptoms     Gargle with a salt-water mixture 3?4 times a day or as needed. To make a salt-water mixture, completely dissolve ??1 tsp of salt in 1 cup of warm water.     Use a cool-mist humidifier to add moisture to the air. This can help you breathe more easily.      Eating and drinking       Drink enough fluid to keep your urine pale yellow.     Eat soups and other clear broths.      General instructions       Take over-the-counter and prescription medicines only as told by your health care provider. These include cold medicines, fever  reducers, and cough suppressants.     Do not use any products that contain nicotine or tobacco, such as cigarettes and e-cigarettes. If you need help quitting, ask your health care provider.      Stay away from secondhand smoke.     Stay up to date on all immunizations, including the yearly (annual) flu vaccine.     Keep all follow-up visits as told by your health care provider. This is important.      How to prevent the spread of infection to others       URIs can be passed from person to person (are contagious). To prevent the infection from spreading:  ? Wash your hands often with soap and water. If soap and water are not available, use hand sanitizer.    ? Avoid touching your mouth, face, eyes, or nose.    ? Cough or sneeze into a tissue or your sleeve or elbow instead of into your hand or into the air.          Contact a health care provider if:     You are getting worse instead of better.     You have a fever or chills.     Your mucus is brown or red.     You have yellow or brown discharge coming from your nose.     You have pain in your face, especially when you bend forward.     You have swollen neck glands.     You have pain while swallowing.     You have white areas in the back of your throat.      Get help right away if:     You have shortness of breath that gets worse.     You have severe or persistent:  ? Headache.    ? Ear pain.    ? Sinus pain.    ? Chest pain.       You have chronic lung disease along with any of the following:  ? Wheezing.    ? Prolonged cough.    ? Coughing up blood.    ? A change in your usual mucus.       You have a stiff neck.     You have changes in your:  ? Vision.    ? Hearing.    ? Thinking.    ? Mood.        Summary  An upper respiratory infection (URI) is a common infection of the nose, throat, and upper air passages that lead to the lungs.     A URI is caused by a virus.     URIs usually get better on their own within 7?10 days.     Medicines cannot cure URIs, but your  health care provider may recommend certain medicines to help relieve symptoms.      This information is not intended to replace advice given to you by your health care provider. Make sure you discuss any questions you have with your health care provider.      Document Revised: 08/23/2020 Document Reviewed: 08/23/2020  Elsevier Patient Education ? 2021 Elsevier Inc.       Dental Abscess      A dental abscess is a collection of pus in or around a tooth that results from an infection. An abscess can cause pain in the affected area as well as other symptoms. Treatment is important to help with symptoms and to prevent the infection from spreading.      What are the causes?    This condition is caused by a bacterial infection around the root of the tooth that involves the inner part of the tooth (pulp). It may result from:   Severe tooth decay.     Trauma to the tooth, such as a broken or chipped tooth, that allows bacteria to enter into the pulp.     Severe gum disease around a tooth.        What increases the risk?    This condition is more likely to develop in males. It is also more likely to develop in people who:   Have dental decay (cavities).     Eat sugary snacks between meals.     Use tobacco products.     Have diabetes.     Have a weakened disease-fighting system (immune system).     Do not brush and care for their teeth regularly.        What are the signs or symptoms?    Symptoms of this condition include:   Severe pain in and around the infected tooth.     Swelling and redness around the infected tooth, in the mouth, or in the face.     Tenderness.     Pus drainage.     Bad breath.     Bitter taste in the mouth.     Difficulty swallowing.     Difficulty opening the mouth.     Nausea.     Vomiting.     Chills.     Swollen neck glands.     Fever.        How is this diagnosed?    This condition is diagnosed based on:   Your symptoms and your medical and dental history.     An examination of the infected tooth.  During the exam, your dentist may tap on the infected tooth.      You may also have X-rays of the affected area.      How is this treated?    This condition is treated by getting rid of the infection. This may be done with:   Incision and drainage. This procedure is done by making an incision in the abscess to drain out the pus. Removing pus is the first priority in treating an abscess.     Antibiotic medicines. These may be used in certain situations.  Antibacterial mouth rinse.     A root canal. This may be performed to save the tooth. Your dentist accesses the visible part of your tooth (crown) with a drill and removes any damaged pulp. Then the space is filled and sealed off.     Tooth extraction. The tooth is pulled out if it cannot be saved by other treatment.      You may also receive treatment for pain, such as:   Acetaminophen or NSAIDs.     Gels that contain a numbing medicine.     An injection to block the pain near your nerve.        Follow these instructions at home:    Medicines     Take over-the-counter and prescription medicines only as told by your dentist.     If you were prescribed an antibiotic, take it as told by your dentist. Do not stop taking the antibiotic even if you start to feel better.     If you were prescribed a gel that contains a numbing medicine, use it exactly as told in the directions. Do not use these gels for children who are younger than 27 years of age.     Do not drive or use heavy machinery while taking prescription pain medicine.      General instructions     Rinse out your mouth often with salt water to relieve pain or swelling. To make a salt-water mixture, completely dissolve ??1 tsp of salt in 1 cup of warm water.     Eat a soft diet while your abscess is healing.     Drink enough fluid to keep your urine pale yellow.     Do not apply heat to the outside of your mouth.     Do not use any products that contain nicotine or tobacco, such as cigarettes and e-cigarettes.  If you need help quitting, ask your health care provider.     Keep all follow-up visits as told by your dentist. This is important.        How is this prevented?     Brush your teeth every morning and night with fluoride toothpaste. Floss one time each day.     Get regularly scheduled dental cleanings.     Consider having a dental sealant applied on teeth that have deep holes (caries).     Drink fluoridated water regularly. This includes most tap water. Check the label on bottled water to see if it contains fluoride.     Drink water instead of sugary drinks.     Eat healthy meals and snacks.     Wear a mouth guard or face shield to protect your teeth while playing sports.      Contact a health care provider if:     Your pain is worse and is not helped by medicine.      Get help right away if:     You have a fever or chills.     Your symptoms suddenly get worse.     You have a very bad headache.     You have problems breathing or swallowing.     You have trouble opening your mouth.     You have swelling in your neck or around your eye.      Summary     A dental abscess is a collection of pus in or around a tooth that results from an infection.     A dental abscess may result  from severe tooth decay, trauma to the tooth, or severe gum disease around a tooth.     Symptoms include severe pain, swelling, redness, and drainage of pus in and around the infected tooth.     The first priority in treating a dental abscess is to drain out the pus. Treatment may also involve removing damage inside the tooth (root canal) or pulling out (extracting) the tooth.      This information is not intended to replace advice given to you by your health care provider. Make sure you discuss any questions you have with your health care provider.      Document Revised: 11/27/2017 Document Reviewed: 08/17/2017  Elsevier Patient Education ? 2021 Elsevier Inc.       ---------------------------------------------------------------------------------------------------------------------  [facility] allows patients to review your COVID and other test results as well as discharge documents from any [name], Emergency Department, surgical center or outpatient lab. Test results are typically available 36 hours after the test is completed.     [name] encourages you to self-enroll in the [facility] Patient Portal.     To begin your self-enrollment process, please visit [email or web address] Under [facility], click on "Sign up now".     NOTE: You must be 16 years and older to use [facility] Self-Enroll online. If you are a parent, caregiver, or guardian; you need an invite to access your child's or dependent's health records. To obtain an invite, contact the Medical Records department at [phone] Monday through Friday, 8-4:30, select option 3 . If we receive your call afterhours, we will return your call the next business day.     If you have issues trying to create or access your account, contact [employer] support at [phone] available 7 days a week 24 hours a day.     Comment:

## 2022-11-11 ENCOUNTER — Emergency Department: Admit: 2022-11-11

## 2022-11-11 ENCOUNTER — Inpatient Hospital Stay
Admit: 2022-11-11 | Discharge: 2022-11-11 | Disposition: A | Discharge status: Home / Self Care | Attending: Emergency Medicine

## 2022-11-11 DIAGNOSIS — J40 Bronchitis, not specified as acute or chronic: Secondary | ICD-10-CM

## 2022-11-11 LAB — COVID-19 & INFLUENZA COMBO (LIAT HOSPITAL)
INFLUENZA A: NOT DETECTED
INFLUENZA B: NOT DETECTED
SARS-CoV-2: NOT DETECTED

## 2022-11-11 MED ORDER — BENZONATATE 200 MG PO CAPS
200 MG | ORAL_CAPSULE | Freq: Three times a day (TID) | ORAL | 0 refills | Status: AC | PRN
Start: 2022-11-11 — End: 2022-11-21

## 2022-11-11 MED ORDER — PREDNISONE 20 MG PO TABS
20 MG | ORAL_TABLET | Freq: Every day | ORAL | 0 refills | Status: AC
Start: 2022-11-11 — End: 2022-11-17

## 2022-11-11 MED ORDER — HYDROCODONE BIT-HOMATROP MBR 5-1.5 MG/5ML PO SOLN
Freq: Four times a day (QID) | ORAL | 0 refills | Status: AC | PRN
Start: 2022-11-11 — End: 2022-11-18

## 2022-11-11 MED ORDER — ALBUTEROL SULFATE (2.5 MG/3ML) 0.083% IN NEBU
Freq: Once | RESPIRATORY_TRACT | Status: AC
Start: 2022-11-11 — End: 2022-11-11

## 2022-11-11 MED ORDER — ALBUTEROL SULFATE HFA 108 (90 BASE) MCG/ACT IN AERS
108 (90 Base) MCG/ACT | Freq: Four times a day (QID) | RESPIRATORY_TRACT | 0 refills | Status: AC | PRN
Start: 2022-11-11 — End: ?

## 2022-11-11 MED ORDER — ALBUTEROL SULFATE (2.5 MG/3ML) 0.083% IN NEBU
RESPIRATORY_TRACT | Status: AC
Start: 2022-11-11 — End: 2022-11-11
  Administered 2022-11-11: 22:00:00 2.5 via RESPIRATORY_TRACT

## 2022-11-11 MED FILL — ALBUTEROL SULFATE (2.5 MG/3ML) 0.083% IN NEBU: RESPIRATORY_TRACT | Qty: 3

## 2022-11-11 NOTE — Discharge Instructions (Signed)
Please call to follow-up with your primary care doctor preferably within the next 24 to 48 hours.  Please return to the emergency room with any acute questions, concerns, worsening status or any delay to your outpatient care.  Please understand you received a general medical exam pertaining to your chief complaint.  This does not replace a thorough work-up done by your primary care provider.  Please ask your primary care provider about further evaluation and review of all labs and imaging that were conducted today and ask about any work-up needed for any abnormality or incidental finding.  Please understand it is your responsibility to follow-up with your primary care provider and follow-up on any and all lab work and imaging.  Failure to follow-up could lead to significant morbidity if not mortality.  It is always a pleasure to take care of you at this hospital we strongly encourage you to return to the emergency room if you have any questions or concerns or any worsening status.    If you do not have a primary care doctor please contact [phone].

## 2022-11-11 NOTE — ED Provider Notes (Signed)
[facility]  EMERGENCY DEPARTMENT ENCOUNTER      Pt Name: [name] [name]  MRN: [id number]  [place] 1986/04/27  Date of evaluation: 11/11/2022  Provider: Vonzella Nipple, MD    CHIEF COMPLAINT       Chief Complaint   Patient presents with    Cough     Reports c/o productive cough x 2 wks, states "getting up lots of phlegm", also c/o swelling to bilat lower extremities x 3 wks. Reports c/o "severe" low back pain, denies GU c/o's amb w upright steady gait.        HISTORY OF PRESENT ILLNESS    HPI  36 y.o. female reports she had COVID about 4 weeks ago.  She had a slight cough at that time.  About 2 weeks later she started getting more of a cough and is now been coughing with intermittent phlegm production over the last 2 weeks.  He is having coughing fits and occasionally take her breath away.  This is caused her entire chest and back to ache.  She denies any recent fevers or chills.  No nausea vomiting.  She does have low back pain.  She periodically feels short of breath with this cough.    Nursing Notes were reviewed.    REVIEW OF SYSTEMS     Review of Systems    Except as noted above the remainder of the review of systems was reviewed and negative.     PAST MEDICAL HISTORY   No past medical history on file.    SURGICAL HISTORY     No past surgical history on file.    CURRENT MEDICATIONS       Previous Medications    No medications on file       ALLERGIES     Penicillin g    FAMILY HISTORY     No family history on file.     SOCIAL HISTORY       Social History     Socioeconomic History    Marital status: Married   Tobacco Use    Smoking status: Never    Smokeless tobacco: Never   Substance and Sexual Activity    Alcohol use: Not Currently    Drug use: Never       SCREENINGS         Glasgow Coma Scale  Eye Opening: Spontaneous  Best Motor Response: Obeys commands                     CIWA Assessment  BP: (!) 166/99  Pulse: (!) 112                 PHYSICAL EXAM    (up to 7 for level 4, 8 or more for level  5)     ED Triage Vitals [11/11/22 1620]   BP Temp Temp Source Pulse Respirations SpO2 Height Weight - Scale   (!) 166/99 97.1 F (36.2 C) Oral (!) 121 18 98 % 1.626 m (5\' 4" ) (!) 155.4 kg (342 lb 9.5 oz)       Physical Exam  Vitals and nursing note reviewed.   Constitutional:       Appearance: Normal appearance. She is obese. She is not toxic-appearing.   HENT:      Head: Normocephalic and atraumatic.      Right Ear: External ear normal.      Left Ear: External ear normal.      Nose: Nose normal.  Mouth/Throat:      Mouth: Mucous membranes are moist.      Pharynx: Oropharynx is clear.   Eyes:      Extraocular Movements: Extraocular movements intact.      Pupils: Pupils are equal, round, and reactive to light.   Cardiovascular:      Rate and Rhythm: Regular rhythm. Tachycardia present.   Pulmonary:      Effort: Pulmonary effort is normal.      Breath sounds: Wheezing (Scattered, bilateral) present.   Abdominal:      General: Bowel sounds are normal. There is no distension.      Tenderness: There is no abdominal tenderness. There is no guarding.   Musculoskeletal:         General: Normal range of motion.      Cervical back: Normal range of motion.   Skin:     General: Skin is warm and dry.   Neurological:      General: No focal deficit present.      Mental Status: She is alert.   Psychiatric:         Mood and Affect: Mood normal.         Behavior: Behavior normal.         Procedures    DIAGNOSTIC RESULTS         XR CHEST PORTABLE   Final Result   Unremarkable AP chest radiograph.            LABS:  Labs Reviewed   COVID-19 & INFLUENZA COMBO [facility])    Narrative:     Is this test for diagnosis or screening?->Diagnosis of ill patient  Symptomatic for COVID-19 as defined by CDC?->Unknown  Date of Symptom Onset->N/A  Hospitalized for COVID-19?->No  Admitted to ICU for COVID-19?->No       All other labs were within normal range or not returned as of this dictation.    EMERGENCY DEPARTMENT COURSE/REASSESSMENT  and MDM:   Vitals:    Vitals:    11/11/22 1620 11/11/22 1659   BP: (!) 166/99    Pulse: (!) 121 (!) 112   Resp: 18    Temp: 97.1 F (36.2 C)    TempSrc: Oral    SpO2: 98%    Weight: (!) 155.4 kg (342 lb 9.5 oz)    Height: 1.626 m (5\' 4" )        Medical Decision Making  Amount and/or Complexity of Data Reviewed  Radiology: ordered.    Risk  Prescription drug management.         ED Course:    ED Course as of 11/11/22 1807   Tue Nov 11, 2022   1803 Chest x-ray is unremarkable.  Viral screen for flu and COVID are negative.    Patient clinically has a viral cough, with some degree of bronchitis.  The wheezing is much improved after the albuterol.  O2 sats remained normal.  We will treat symptomatically for cough and get a short course of steroids for her wheezing in addition to albuterol for use at home [CM]      ED Course User Index  [CM] Nov 13, 2022, MD       Pulse oximetry is 98% on room air    which is acceptable for this patient by my interpretation        FINAL IMPRESSION      1. Bronchitis    2. Subacute cough          DISPOSITION/PLAN  DISPOSITION Decision To Discharge 11/11/2022 06:04:05 PM      PATIENT REFERRED TO:  Your primary care physician    Schedule an appointment as soon as possible for a visit in 1 week  If symptoms worsen      DISCHARGE MEDICATIONS:  New Prescriptions    ALBUTEROL SULFATE HFA (PROAIR HFA) 108 (90 BASE) MCG/ACT INHALER    Inhale 2 puffs into the lungs every 6 hours as needed for Wheezing    BENZONATATE (TESSALON) 200 MG CAPSULE    Take 1 capsule by mouth 3 times daily as needed for Cough    HYDROCODONE HOMATROPINE (HYCODAN) 5-1.5 MG/5ML SOLUTION    Take 5 mLs by mouth every 6 hours as needed (cough) for up to 7 days. Max Daily Amount: 20 mLs    PREDNISONE (DELTASONE) 20 MG TABLET    Take 2 tablets by mouth daily for 5 days     Controlled Substances Monitoring:          No data to display                (Please note that portions of this note were completed with a voice  recognition program.  Efforts were made to edit the dictations but occasionally words are mis-transcribed.)    Renaee Munda, MD (electronically signed)  Attending Emergency Physician           Renaee Munda, MD  11/11/22 1807

## 2023-02-23 ENCOUNTER — Inpatient Hospital Stay
Admit: 2023-02-23 | Discharge: 2023-02-23 | Disposition: A | Discharge status: Home / Self Care | Payer: MEDICAID | Attending: Emergency Medicine

## 2023-02-23 ENCOUNTER — Emergency Department: Admit: 2023-02-23 | Payer: MEDICAID

## 2023-02-23 DIAGNOSIS — R202 Paresthesia of skin: Secondary | ICD-10-CM

## 2023-02-23 DIAGNOSIS — R030 Elevated blood-pressure reading, without diagnosis of hypertension: Secondary | ICD-10-CM

## 2023-02-23 LAB — BASIC METABOLIC PANEL
Anion Gap: 14 mmol/L (ref 2–17)
BUN: 15 mg/dL (ref 6–20)
CO2: 21 mmol/L — ABNORMAL LOW (ref 22–29)
Calcium: 9.3 mg/dL (ref 8.6–10.0)
Chloride: 100 mmol/L (ref 98–107)
Creatinine: 0.8 mg/dL (ref 0.5–1.0)
Est, Glom Filt Rate: 98 mL/min/1.73m (ref >=60)
Glucose: 110 mg/dL — ABNORMAL HIGH (ref 70–99)
OSMOLALITY CALCULATED: 271 mOsm/kg (ref 270–287)
Potassium: 4 mmol/L (ref 3.5–5.3)
Sodium: 135 mmol/L (ref 135–145)

## 2023-02-23 LAB — TROPONIN: Troponin, High Sensitivity: 9 ng/L (ref 0–14)

## 2023-02-23 LAB — MAGNESIUM: Magnesium: 2 mg/dL (ref 1.6–2.6)

## 2023-02-23 LAB — CBC WITH AUTO DIFFERENTIAL
Absolute Baso #: 0 10*3/uL (ref 0.0–0.2)
Absolute Eos #: 0.3 10*3/uL (ref 0.0–0.5)
Absolute Lymph #: 2 10*3/uL (ref 1.0–3.2)
Absolute Mono #: 0.4 10*3/uL (ref 0.3–1.0)
Basophils %: 0.4 % (ref 0.0–2.0)
Eosinophils %: 3.3 % (ref 0.0–7.0)
Hematocrit: 38.8 % (ref 34.0–47.0)
Hemoglobin: 12.4 g/dL (ref 11.5–15.7)
Immature Grans (Abs): 0.06 10*3/uL (ref 0.00–0.06)
Immature Granulocytes: 0.7 % — ABNORMAL HIGH (ref 0.0–0.6)
Lymphocytes: 24.4 % (ref 15.0–45.0)
MCH: 27.9 pg (ref 27.0–34.5)
MCHC: 32 g/dL (ref 32.0–36.0)
MCV: 87.2 fL (ref 81.0–99.0)
MPV: 10.3 fL (ref 7.2–13.2)
Monocytes: 4.8 % (ref 4.0–12.0)
Neutrophils %: 66.4 % (ref 42.0–74.0)
Neutrophils Absolute: 5.3 10*3/uL (ref 1.6–7.3)
Platelets: 314 10*3/uL (ref 140–440)
RBC: 4.45 x10e6/mcL (ref 3.60–5.20)
RDW: 13.3 % (ref 11.0–16.0)
WBC: 8.1 10*3/uL (ref 3.8–10.6)

## 2023-02-23 MED ORDER — SODIUM CHLORIDE 0.9 % IV BOLUS
0.9 | Freq: Once | INTRAVENOUS | Status: AC
Start: 2023-02-23 — End: 2023-02-23
  Administered 2023-02-23: 20:00:00 1000 mL via INTRAVENOUS

## 2023-02-23 NOTE — ED Provider Notes (Signed)
[facility]  EMERGENCY DEPARTMENT ENCOUNTER      Pt Name: [name] [name]  MRN: [id number]  [place] August 30, 1986  Date of evaluation: 02/23/2023  Provider: Danella Penton, MD    CHIEF COMPLAINT       Chief Complaint   Patient presents with    Numbness     To triage with hypertension and chest pain the past few days with bilateral leg swelling. Pt endorses numbness to her left hand that started an hour ago.          HISTORY OF PRESENT ILLNESS   (Location/Symptom, Timing/Onset, Context/Setting, Quality, Duration, Modifying Factors, Severity)  Note limiting factors.   Amarissa Hughett is a 37 y.o. female who presents to the emergency department     Patient with a history of asthma presents complaining of 1 hour history of numbness to the 3 medial fingers of her left hand as well as her posterior left neck.  She denies any neck or shoulder pain.  She denies chest pain or dyspnea.  She does report a headache as well as general myalgias for several weeks to months.  Patient says she has been evaluated multiple times by physicians in [place] and was told nothing was abnormal.  Patiently recently moved to the area and has not established a PCP.  Patient denies change in vision or speech.  Denies neck stiffness or pain.  She denies any focal weakness.  No fever or chills.  She denies any recent URI symptoms.  Of note, chest pain is mentioned in the nurses note but patient denies any chest pain or dyspnea.    The history is provided by the patient.   Numbness  This is a new problem. The current episode started 1 to 2 hours ago. The problem occurs constantly. The problem has not changed since onset.Pertinent negatives include no chest pain, no abdominal pain, no headaches and no shortness of breath. Nothing aggravates the symptoms. Nothing relieves the symptoms. She has tried nothing for the symptoms.       Nursing Notes were reviewed.    REVIEW OF SYSTEMS    (2-9 systems for level 4, 10 or more for level 5)      Review of Systems   Constitutional: Negative.    HENT: Negative.     Respiratory: Negative.  Negative for shortness of breath.    Cardiovascular: Negative.  Negative for chest pain.   Gastrointestinal: Negative.  Negative for abdominal pain.   Musculoskeletal: Negative.    Skin: Negative.    Neurological:  Positive for numbness. Negative for dizziness, facial asymmetry, speech difficulty, weakness, light-headedness and headaches.       Except as noted above the remainder of the review of systems was reviewed and negative.       PAST MEDICAL HISTORY   No past medical history on file.      SURGICAL HISTORY     No past surgical history on file.      CURRENT MEDICATIONS       Previous Medications    ALBUTEROL SULFATE HFA (PROAIR HFA) 108 (90 BASE) MCG/ACT INHALER    Inhale 2 puffs into the lungs every 6 hours as needed for Wheezing       ALLERGIES     Penicillin g    FAMILY HISTORY     No family history on file.       SOCIAL HISTORY       Social History  Socioeconomic History    Marital status: Married   Tobacco Use    Smoking status: Never    Smokeless tobacco: Never   Substance and Sexual Activity    Alcohol use: Not Currently    Drug use: Never       SCREENINGS   NIH Stroke Scale  Interval: Baseline  Level of Consciousness (1a): Alert  LOC Questions (1b): Answers both correctly  LOC Commands (1c): Performs both tasks correctly  Best Gaze (2): Normal  Visual (3): No visual loss  Facial Palsy (4): Normal symmetrical movement  Motor Arm, Left (5a): No drift  Motor Arm, Right (5b): No drift  Motor Leg, Left (6a): No drift  Motor Leg, Right (6b): No drift  Limb Ataxia (7): Absent  Sensory (8): (!) Mild to Moderate  Best Language (9): No aphasia  Dysarthria (10): Normal  Extinction and Inattention (11): No abnormality  Total: 1     Glasgow Coma Scale  Eye Opening: Spontaneous  Best Verbal Response: Oriented  Best Motor Response: Obeys commands  Glasgow Coma Scale Score: 15                     CIWA Assessment  BP:  133/68  Pulse: 87                 PHYSICAL EXAM    (up to 7 for level 4, 8 or more for level 5)     ED Triage Vitals [02/23/23 1422]   BP Temp Temp src Pulse Respirations SpO2 Height Weight - Scale   (!) 179/116 98.7 F (37.1 C) -- (!) 118 16 99 % 1.626 m (5' 4"$ ) (!) 140.6 kg (310 lb)       Physical Exam  Vitals and nursing note reviewed.   Constitutional:       General: She is not in acute distress.     Appearance: Normal appearance. She is not ill-appearing, toxic-appearing or diaphoretic.   HENT:      Head: Normocephalic and atraumatic.   Eyes:      Extraocular Movements: Extraocular movements intact.      Pupils: Pupils are equal, round, and reactive to light.   Cardiovascular:      Rate and Rhythm: Normal rate and regular rhythm.      Heart sounds: Normal heart sounds.   Pulmonary:      Effort: Pulmonary effort is normal. No respiratory distress.      Breath sounds: Normal breath sounds. No wheezing.   Abdominal:      Palpations: Abdomen is soft.      Tenderness: There is no abdominal tenderness. There is no guarding.   Musculoskeletal:         General: No swelling or tenderness. Normal range of motion.      Cervical back: Normal range of motion and neck supple. No rigidity or tenderness.   Skin:     General: Skin is warm and dry.   Neurological:      General: No focal deficit present.      Mental Status: She is alert.   Psychiatric:         Mood and Affect: Mood normal.         Behavior: Behavior normal.         DIAGNOSTIC RESULTS     EKG: All EKG's are interpreted by the Emergency Department Physician who either signs or Co-signs this chart in the absence of a cardiologist.      RADIOLOGY:  Non-plain film images such as CT, Ultrasound and MRI are read by the radiologist. Plain radiographic images are visualized and preliminarily interpreted by the emergency physician with the below findings:      Interpretation per the Radiologist below, if available at the time of this note:    CT HEAD WO CONTRAST   Final  Result   1. Limited study due to excessive motion/beam hardening artifact    No acute infarct or hemorrhage identified.            ED BEDSIDE ULTRASOUND:   Performed by ED Physician - none    LABS:  Labs Reviewed   CBC WITH AUTO DIFFERENTIAL - Abnormal; Notable for the following components:       Result Value    Immature Granulocytes 0.7 (*)     All other components within normal limits   BASIC METABOLIC PANEL - Abnormal; Notable for the following components:    CO2 21 (*)     Glucose 110 (*)     All other components within normal limits   MAGNESIUM   TROPONIN       All other labs were within normal range or not returned as of this dictation.    EMERGENCY DEPARTMENT COURSE and DIFFERENTIAL DIAGNOSIS/MDM:   Vitals:    Vitals:    02/23/23 1500 02/23/23 1530 02/23/23 1600 02/23/23 1630   BP: 133/80 131/80 133/76 133/68   Pulse: 89 96 96 87   Resp: 10 15 15 11   $ Temp:       SpO2: 100% 99% 100% 100%   Weight:       Height:               Medical Decision Making  Patient presents with multiple complaints but significant for numbness in her 3 left medial fingers and posterior left neck for approximate 1 hour prior to arrival.  Patient notes she has been having the symptoms intermittently for several years and has been in evaluated by several doctors in [place] for the same.  Patient says she has had cortisone shots in her back without relief.  Her workup here, including CT head, is negative.  She has reported elevated blood pressures at home but at discharge here her blood pressure is now 122/72 without intervention.  She says she has lived here for approximately 3 years but has not had a PCP established.  I will refer her to PCP for follow-up and given her return precautions.    Amount and/or Complexity of Data Reviewed  Labs: ordered.     Details: Negative  Troponin negative  Radiology: ordered.     Details: CT head negative  ECG/medicine tests: ordered.     Details: ECG interpreted by me.  Sinus tachycardia rate  of 104.  No ectopy, no ischemia.  No previous tracing available.    Risk  Prescription drug management.            REASSESSMENT          CRITICAL CARE TIME   Total Critical Care time was minutes, excluding separately reportable procedures.  There was a high probability of clinically significant/life threatening deterioration in the patient's condition which required my urgent intervention.     CONSULTS:  None    PROCEDURES:  Unless otherwise noted below, none     Procedures      FINAL IMPRESSION      1. Paresthesias    2. Elevated blood pressure reading without diagnosis of hypertension  DISPOSITION/PLAN   DISPOSITION Decision To Discharge 02/23/2023 05:04:54 PM      PATIENT REFERRED TO:  [id number]    Call       [facility]  [place]  [phone]    If symptoms worsen      DISCHARGE MEDICATIONS:  New Prescriptions    No medications on file     Controlled Substances Monitoring:          No data to display                (Please note that portions of this note were completed with a voice recognition program.  Efforts were made to edit the dictations but occasionally words are mis-transcribed.)    Danella Penton, MD (electronically signed)  Attending Emergency Physician             Karmen Stabs, MD  02/23/23 1705

## 2023-03-01 LAB — EKG 12-LEAD
P Axis: 61 degrees
P-R Interval: 124 ms
Q-T Interval: 342 ms
QRS Duration: 84 ms
QTc Calculation (Bazett): 451 ms
R Axis: 61 degrees
T Axis: 64 degrees
Ventricular Rate: 104 {beats}/min
